# Patient Record
Sex: Female | Born: 1970 | Race: Black or African American | Hispanic: No | Marital: Single | Smoking: Never smoker
Health system: Southern US, Community
[De-identification: ages and names within clinical notes are randomized; demographics above are authoritative.]

## PROBLEM LIST (undated history)

## (undated) DIAGNOSIS — E119 Type 2 diabetes mellitus without complications: Secondary | ICD-10-CM

## (undated) DIAGNOSIS — I10 Essential (primary) hypertension: Secondary | ICD-10-CM

## (undated) HISTORY — PX: ADRENAL GLAND SURGERY: SHX544

## (undated) HISTORY — PX: APPENDECTOMY: SHX54

---

## 2006-11-05 ENCOUNTER — Other Ambulatory Visit: Payer: Self-pay

## 2006-11-06 ENCOUNTER — Observation Stay: Payer: Self-pay | Admitting: *Deleted

## 2006-12-07 ENCOUNTER — Ambulatory Visit: Payer: Self-pay | Admitting: Gastroenterology

## 2006-12-10 ENCOUNTER — Ambulatory Visit: Payer: Self-pay | Admitting: Gastroenterology

## 2007-01-28 ENCOUNTER — Emergency Department: Payer: Self-pay | Admitting: Emergency Medicine

## 2007-02-02 ENCOUNTER — Ambulatory Visit: Payer: Self-pay | Admitting: Internal Medicine

## 2008-11-27 ENCOUNTER — Encounter: Payer: Self-pay | Admitting: Orthopedic Surgery

## 2008-12-26 ENCOUNTER — Encounter: Payer: Self-pay | Admitting: Orthopedic Surgery

## 2009-01-26 ENCOUNTER — Encounter: Payer: Self-pay | Admitting: Orthopedic Surgery

## 2009-11-26 ENCOUNTER — Ambulatory Visit: Payer: Self-pay | Admitting: Family Medicine

## 2009-12-02 ENCOUNTER — Ambulatory Visit: Payer: Self-pay | Admitting: Family Medicine

## 2009-12-05 ENCOUNTER — Ambulatory Visit: Payer: Self-pay | Admitting: Family Medicine

## 2009-12-09 ENCOUNTER — Ambulatory Visit: Payer: Self-pay | Admitting: Oncology

## 2009-12-11 LAB — CANCER ANTIGEN 27.29: CA 27.29: 11.6 U/mL (ref 0.0–38.6)

## 2009-12-12 ENCOUNTER — Ambulatory Visit: Payer: Self-pay | Admitting: Oncology

## 2009-12-26 ENCOUNTER — Ambulatory Visit: Payer: Self-pay | Admitting: Oncology

## 2010-03-07 ENCOUNTER — Ambulatory Visit: Payer: Self-pay | Admitting: Oncology

## 2010-03-27 ENCOUNTER — Ambulatory Visit: Payer: Self-pay | Admitting: Oncology

## 2010-09-15 ENCOUNTER — Ambulatory Visit: Payer: Self-pay | Admitting: Family Medicine

## 2011-03-15 ENCOUNTER — Emergency Department: Payer: Self-pay | Admitting: Emergency Medicine

## 2011-03-15 LAB — COMPREHENSIVE METABOLIC PANEL
Alkaline Phosphatase: 66 U/L (ref 50–136)
Anion Gap: 13 (ref 7–16)
Bilirubin,Total: 0.7 mg/dL (ref 0.2–1.0)
Calcium, Total: 8.2 mg/dL — ABNORMAL LOW (ref 8.5–10.1)
Co2: 22 mmol/L (ref 21–32)
Creatinine: 0.85 mg/dL (ref 0.60–1.30)
EGFR (African American): >60
EGFR (Non-African Amer.): >60
Osmolality: 284 (ref 275–301)
Potassium: 2.7 mmol/L — ABNORMAL LOW (ref 3.5–5.1)
SGOT(AST): 17 U/L (ref 15–37)
SGPT (ALT): 20 U/L

## 2011-03-15 LAB — CBC
HCT: 43 % (ref 35.0–47.0)
MCHC: 32.7 g/dL (ref 32.0–36.0)
Platelet: 193 10*3/uL (ref 150–440)
RBC: 5.04 10*6/uL (ref 3.80–5.20)
RDW: 13.5 % (ref 11.5–14.5)

## 2011-03-15 LAB — TROPONIN I: Troponin-I: <0.02 ng/mL

## 2011-10-03 DIAGNOSIS — D3610 Benign neoplasm of peripheral nerves and autonomic nervous system, unspecified: Secondary | ICD-10-CM | POA: Insufficient documentation

## 2012-11-20 ENCOUNTER — Emergency Department: Payer: Self-pay | Admitting: Emergency Medicine

## 2013-03-14 IMAGING — CR DG CHEST 2V
1 series · 2 of 2 positions shown · non-contrast
Comparison: none

REASON FOR EXAM: chest pain
COMMENTS:

PROCEDURE:     DXR - DXR CHEST PA (OR AP) AND LATERAL  - March 15, 2011  [DATE]
RESULT:     The lungs are clear. The heart and pulmonary vessels are normal.
The bony and mediastinal structures are unremarkable. There is no effusion.
There is no pneumothorax or evidence of congestive failure.

[Series 1: w chest pa · 0.14mm/px · 2 of 2 slices shown]
[im 1/2]
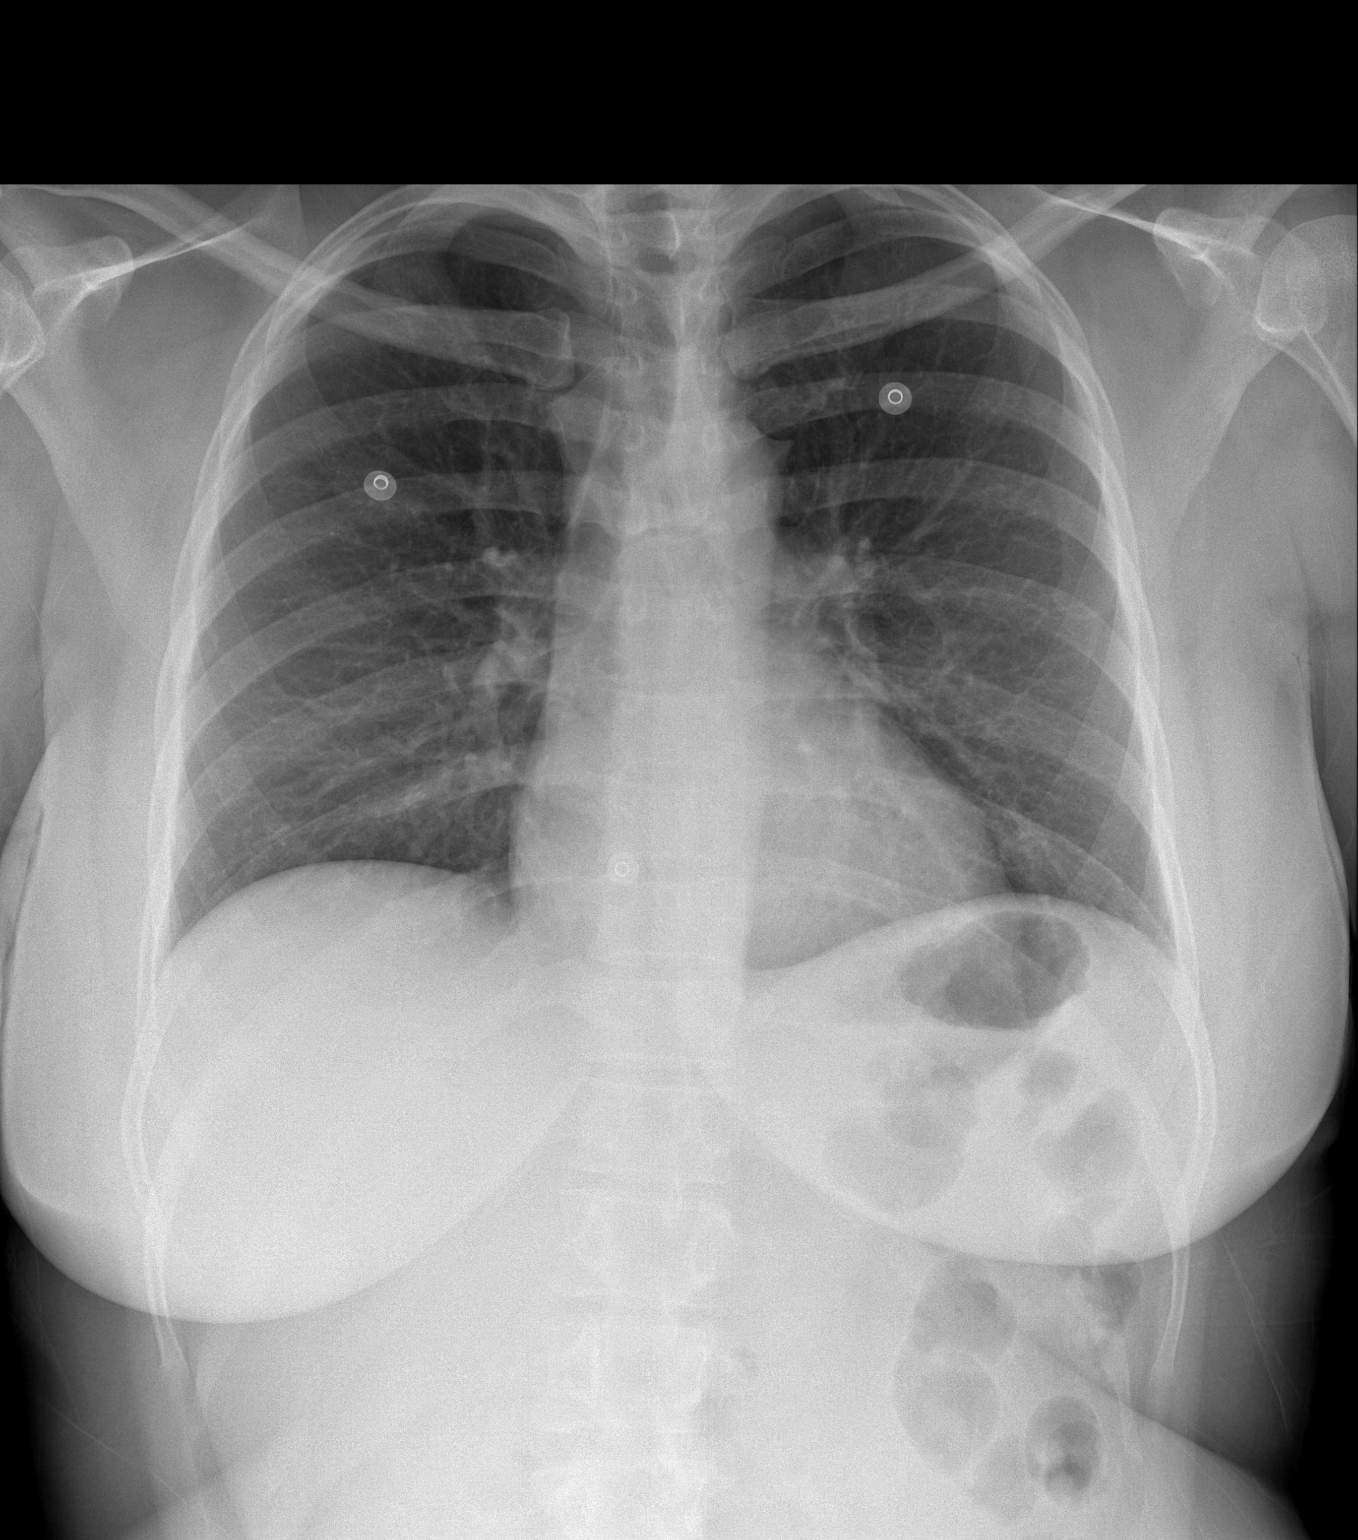
[im 2/2]
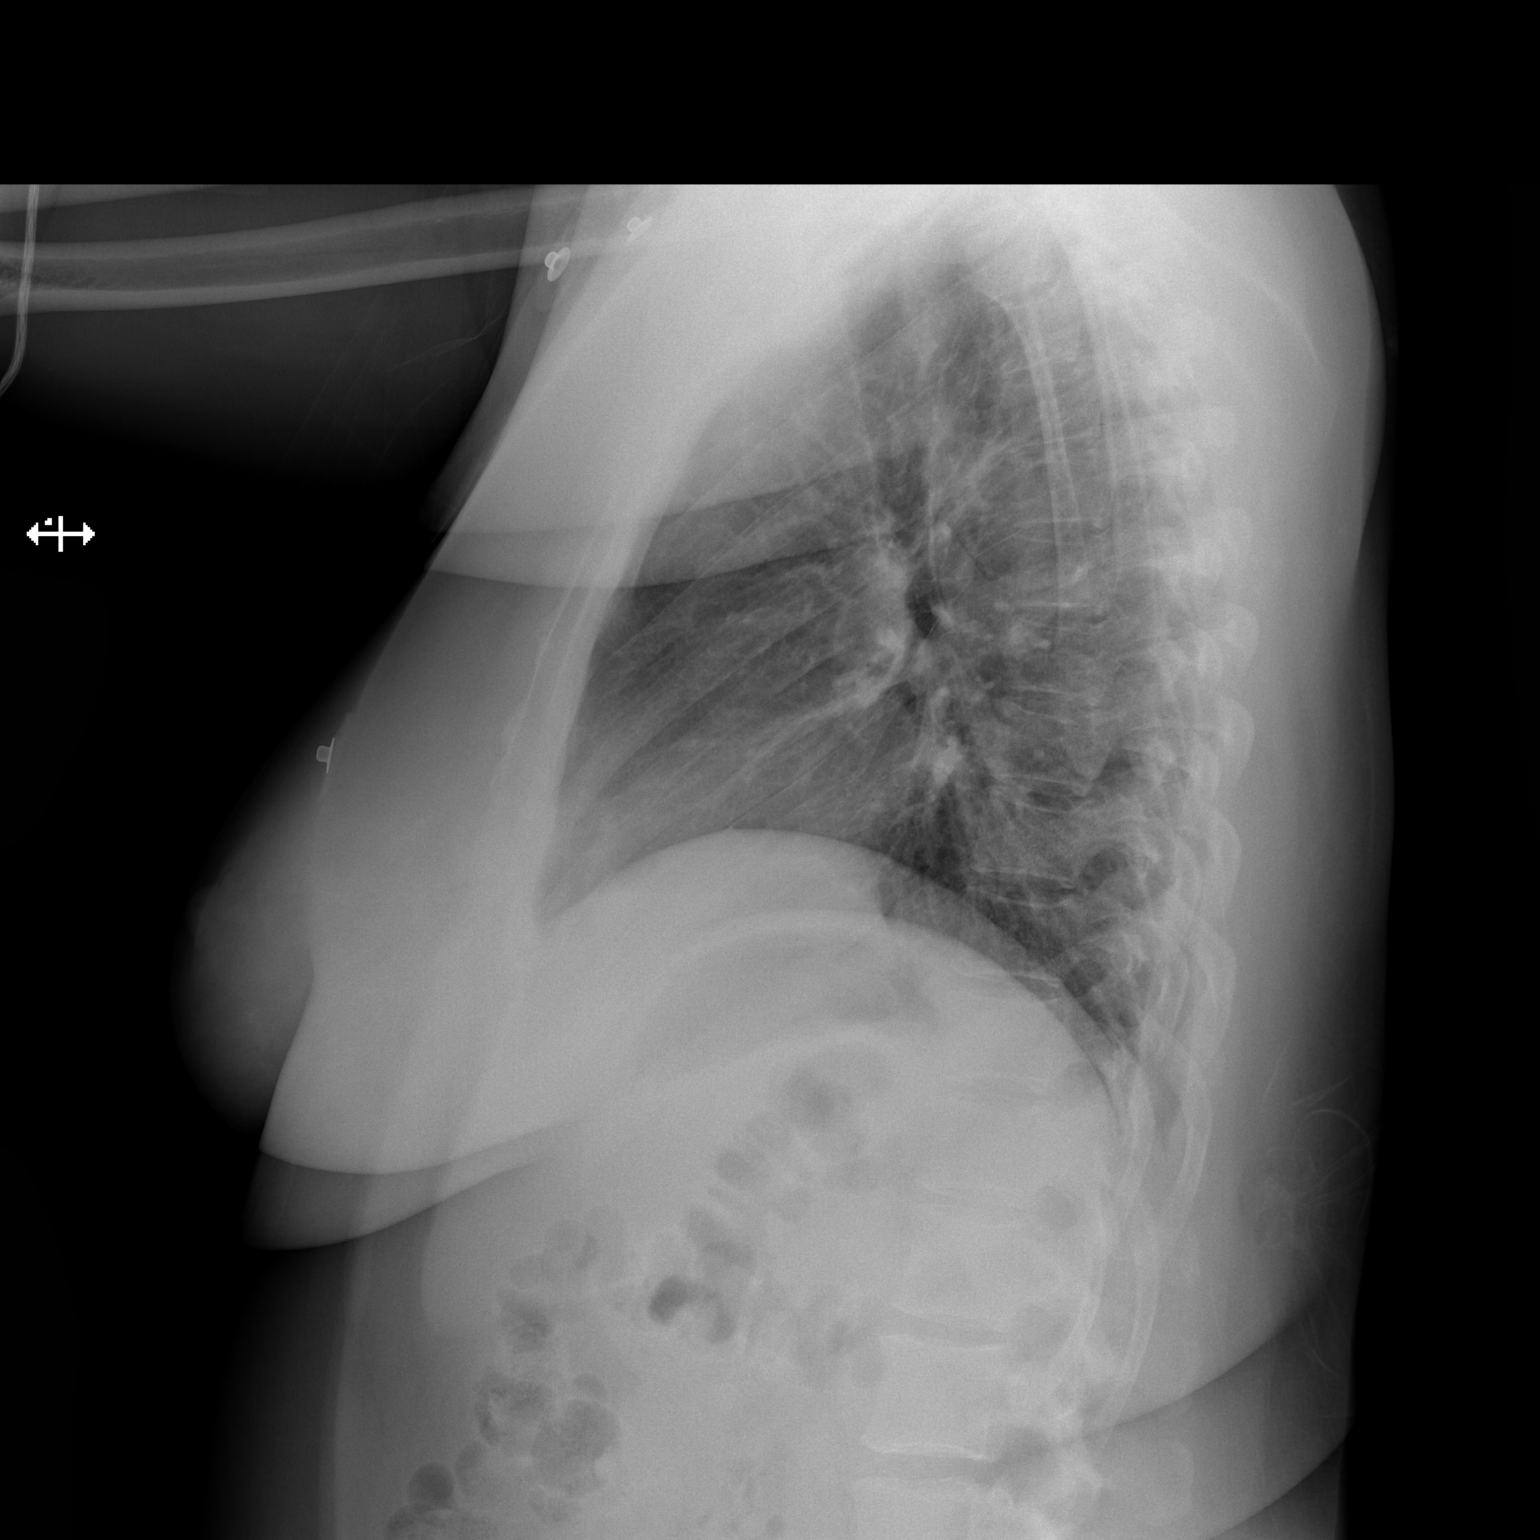

[2 of 2 positions shown; findings below may reference images not displayed]

IMPRESSION: No acute cardiopulmonary disease.

## 2014-12-01 ENCOUNTER — Emergency Department: Payer: BC Managed Care – PPO

## 2014-12-01 ENCOUNTER — Encounter: Payer: Self-pay | Admitting: *Deleted

## 2014-12-01 ENCOUNTER — Emergency Department
Admission: EM | Admit: 2014-12-01 | Discharge: 2014-12-01 | Disposition: A | Discharge status: Home / Self Care | Payer: BC Managed Care – PPO | Attending: Emergency Medicine | Admitting: Emergency Medicine

## 2014-12-01 DIAGNOSIS — I10 Essential (primary) hypertension: Secondary | ICD-10-CM | POA: Diagnosis not present

## 2014-12-01 DIAGNOSIS — M25531 Pain in right wrist: Secondary | ICD-10-CM | POA: Diagnosis not present

## 2014-12-01 DIAGNOSIS — Z880 Allergy status to penicillin: Secondary | ICD-10-CM | POA: Insufficient documentation

## 2014-12-01 DIAGNOSIS — E119 Type 2 diabetes mellitus without complications: Secondary | ICD-10-CM | POA: Diagnosis not present

## 2014-12-01 HISTORY — DX: Essential (primary) hypertension: I10

## 2014-12-01 HISTORY — DX: Type 2 diabetes mellitus without complications: E11.9

## 2014-12-01 MED ORDER — NAPROXEN 500 MG PO TABS: 500.0000 mg | ORAL_TABLET | Freq: Two times a day (BID) | ORAL | Status: DC
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
## 2014-12-01 NOTE — Discharge Instructions (Signed)
Wear wrist support from 3-5 days as needed.

## 2014-12-01 NOTE — ED Notes (Signed)
Did not sign electronically, technical difficulty.   E-sign page was printed and signed by pt. Sent to MR to be scanned
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
## 2017-01-31 ENCOUNTER — Emergency Department: Payer: BC Managed Care – PPO

## 2017-01-31 ENCOUNTER — Emergency Department
Admission: EM | Admit: 2017-01-31 | Discharge: 2017-01-31 | Disposition: A | Discharge status: Home / Self Care | Payer: BC Managed Care – PPO | Attending: Emergency Medicine | Admitting: Emergency Medicine

## 2017-01-31 DIAGNOSIS — Z791 Long term (current) use of non-steroidal anti-inflammatories (NSAID): Secondary | ICD-10-CM | POA: Insufficient documentation

## 2017-01-31 DIAGNOSIS — E119 Type 2 diabetes mellitus without complications: Secondary | ICD-10-CM | POA: Insufficient documentation

## 2017-01-31 DIAGNOSIS — I10 Essential (primary) hypertension: Secondary | ICD-10-CM | POA: Diagnosis not present

## 2017-01-31 DIAGNOSIS — B9789 Other viral agents as the cause of diseases classified elsewhere: Secondary | ICD-10-CM

## 2017-01-31 DIAGNOSIS — B349 Viral infection, unspecified: Secondary | ICD-10-CM | POA: Insufficient documentation

## 2017-01-31 DIAGNOSIS — J069 Acute upper respiratory infection, unspecified: Secondary | ICD-10-CM | POA: Diagnosis not present

## 2017-01-31 DIAGNOSIS — R05 Cough: Secondary | ICD-10-CM | POA: Diagnosis present

## 2017-01-31 LAB — GROUP A STREP BY PCR: Group A Strep by PCR: NOT DETECTED

## 2017-01-31 MED ORDER — LIDOCAINE HCL (PF) 1 % IJ SOLN
5.0000 mL | Freq: Once | INTRAMUSCULAR | Status: AC
  Administered 2017-01-31: 5 mL
  Filled 2017-01-31: qty 5

## 2017-01-31 MED ORDER — BENZONATATE 100 MG PO CAPS: 100.0000 mg | ORAL_CAPSULE | Freq: Four times a day (QID) | ORAL | 0 refills | Status: AC | PRN

## 2017-01-31 NOTE — ED Notes (Signed)
Reviewed discharge instructions, follow-up care, and prescriptions with patient. Patient verbalized understanding of all information reviewed. Patient stable, with no distress noted at this time.    

## 2017-01-31 NOTE — ED Triage Notes (Signed)
Patient c/o productive cough and congestion beginning Wednesday. Patient reports she saw her PCP Friday and was instructed to take Mucinex. Patient reports continued cough and hoarseness.

## 2017-01-31 NOTE — ED Provider Notes (Signed)
[facility] Emergency Department Provider Note  ____________________________________________   First MD Initiated Contact with Patient 01/31/17 [phone]     (approximate)  I have reviewed the triage vital signs and the nursing notes.   HISTORY  Chief Complaint Cough    HPI Rose Thomas is a 47 y.o. female who self presents to the emergency department with 3-4 days of rhinorrhea, low-grade fever, congestion, and nonproductive cough.  She went to her primary care physician last Friday who diagnosed her with an upper respiratory tract infection and told her to take over-the-counter medications.  She comes to the emergency department tonight because her cough has been keeping her up at night.  Her symptoms had insidious onset slowly progressive.  Her cough is now moderate.  Nothing seems to make it better or worse.  Past Medical History:  Diagnosis Date  . Diabetes mellitus without complication (HCC)   . Hypertension     There are no active problems to display for this patient.   Past Surgical History:  Procedure Laterality Date  . ADRENAL GLAND SURGERY     removed on right side.  . APPENDECTOMY      Prior to Admission medications   Medication Sig Start Date End Date Taking? Authorizing Provider  benzonatate (TESSALON PERLES) 100 MG capsule Take 1 capsule (100 mg total) by mouth every 6 (six) hours as needed for cough. 01/31/17 01/31/18  Merrily Brittleifenbark, Fabion Gatson, MD  naproxen (NAPROSYN) 500 MG tablet Take 1 tablet (500 mg total) by mouth 2 (two) times daily with a meal. 12/01/14   Joni ReiningSmith, Ronald K, PA-C    Allergies Percocet [oxycodone-acetaminophen] and Penicillins  No family history on file.  Social History Social History   Tobacco Use  . Smoking status: Never Smoker  . Smokeless tobacco: Never Used  Substance Use Topics  . Alcohol use: Yes    Comment: occasional  . Drug use: Not on file    Review of Systems Constitutional: Positive for fevers ENT:  Positive for sore throat. Cardiovascular: Denies chest pain. Respiratory: Positive for cough. Gastrointestinal: No abdominal pain.  No nausea, no vomiting.  No diarrhea.  No constipation. Musculoskeletal: Negative for back pain. Neurological: Negative for headaches   ____________________________________________   PHYSICAL EXAM:  VITAL SIGNS: ED Triage Vitals  Enc Vitals Group     BP 01/31/17 0139 (!) 155/81     Pulse Rate 01/31/17 0139 98     Resp 01/31/17 0139 18     Temp 01/31/17 0139 99 F (37.2 C)     Temp Source 01/31/17 0139 Oral     SpO2 01/31/17 0139 99 %     Weight 01/31/17 0141 183 lb (83 kg)     Height 01/31/17 0141 5\' 6"  (1.676 m)     Head Circumference --      Peak Flow --      Pain Score 01/31/17 0139 10     Pain Loc --      Pain Edu? --      Excl. in GC? --     Constitutional: Alert and oriented x4 speaks in full clear sentences with somewhat hoarse voice actively coughing with a dry cough during exam Head: Atraumatic. Nose: No congestion/rhinnorhea. Mouth/Throat: No trismus normal oropharynx Neck: No stridor.   Cardiovascular: Regular rate and rhythm Respiratory: Normal respiratory effort.  No retractions.  Clear to auscultation bilaterally Gastrointestinal: Soft nontender Neurologic:  Normal speech and language. No gross focal neurologic deficits are appreciated.  Skin:  Skin is warm, dry and intact. No rash noted.    ____________________________________________  LABS (all labs ordered are listed, but only abnormal results are displayed)  Labs Reviewed  GROUP A STREP BY PCR    Lab work reviewed by me strep negative __________________________________________  EKG   ____________________________________________  RADIOLOGY  Chest x-ray reviewed by me with no acute disease ____________________________________________   DIFFERENTIAL includes but not limited to  Post viral cough, upper respiratory tract infection, laryngitis, pneumonia,  bronchitis   PROCEDURES  Procedure(s) performed: no  Procedures  Critical Care performed: no  Observation: no ____________________________________________   INITIAL IMPRESSION / ASSESSMENT AND PLAN / ED COURSE  Pertinent labs & imaging results that were available during my care of the patient were reviewed by me and considered in my medical decision making (see chart for details).  Patient arrives hemodynamically stable and well-appearing.  She does have a persistent dry cough.  Her lungs are clear and her chest x-ray is negative for acute pathology.  I nebulized 5 cc of 1% lidocaine which completely eliminated the patient's cough and she felt significantly improved.  I had a lengthy discussion with the patient regarding her diagnosis and the predicted clinical course and that cough is the last thing to go away.  She understands to remain well-hydrated and I will prescribe her Jerilynn Som for symptomatic treatment.  She is discharged home in improved condition verbalizes understanding and agreement the plan.      ____________________________________________   FINAL CLINICAL IMPRESSION(S) / ED DIAGNOSES  Final diagnoses:  Viral URI with cough      NEW MEDICATIONS STARTED DURING THIS VISIT:  This SmartLink is deprecated. Use AVSMEDLIST instead to display the medication list for a patient.   Note:  This document was prepared using Dragon voice recognition software and may include unintentional dictation errors.      Merrily Brittle, MD 01/31/17 [phone]

## 2017-01-31 NOTE — Discharge Instructions (Signed)
Fortunately today your x-ray was reassuring and you do not have pneumonia.  It is normal for a cough the last up to a full 3-4 weeks after the infection that you have.  Please make sure you remain well-hydrated and use your cough medication as needed for severe symptoms.  Follow-up with your primary care physician as needed and return to the emergency department for any concerns.  It was a pleasure to take care of you today, and thank you for coming to our emergency department.  If you have any questions or concerns before leaving please ask the nurse to grab me and I'm more than happy to go through your aftercare instructions again.  If you were prescribed any opioid pain medication today such as Norco, Vicodin, Percocet, morphine, hydrocodone, or oxycodone please make sure you do not drive when you are taking this medication as it can alter your ability to drive safely.  If you have any concerns once you are home that you are not improving or are in fact getting worse before you can make it to your follow-up appointment, please do not hesitate to call [phone] and come back for further evaluation.  Merrily BrittleNeil Lucianna Ostlund, MD  Results for orders placed or performed during the hospital encounter of 01/31/17  Group A Strep by PCR  Result Value Ref Range   Group A Strep by PCR NOT DETECTED NOT DETECTED   Dg Chest 2 View  Result Date: 01/31/2017 CLINICAL DATA:  Productive cough and congestion beginning on Wednesday. EXAM: CHEST  2 VIEW COMPARISON:  03/15/2011 FINDINGS: The heart size and mediastinal contours are within normal limits. Both lungs are clear. The visualized skeletal structures are unremarkable. Surgical clips in the right upper quadrant. IMPRESSION: No active cardiopulmonary disease. Electronically Signed   By: Burman NievesWilliam  Stevens M.D.   On: 01/31/2017 02:07

## 2017-10-25 ENCOUNTER — Ambulatory Visit: Payer: BC Managed Care – PPO | Attending: Emergency Medicine

## 2017-10-25 DIAGNOSIS — M25571 Pain in right ankle and joints of right foot: Secondary | ICD-10-CM | POA: Diagnosis not present

## 2017-10-25 DIAGNOSIS — M6281 Muscle weakness (generalized): Secondary | ICD-10-CM | POA: Insufficient documentation

## 2017-10-25 NOTE — Therapy (Addendum)
[facility]YS[facility]ICINE [address] [place], [address] Phone: [phone]   Fax:  [phone]  Physical Therapy Evaluation  Patient Details  Name: Rose Thomas MRN: [id number] Date of Birth: 13-Jun-1970 Referring Provider (PT): Paschal Dopp   Encounter Date: 10/25/2017  PT End of Session - 10/26/17 1100    Visit Number  1    Number of Visits  13    Date for PT Re-Evaluation  12/06/17    Authorization Type  Progress note: 1/10    PT Start Time  1645    PT Stop Time  1730    PT Time Calculation (min)  45 min    Activity Tolerance  Patient tolerated treatment well    Behavior During Therapy  [facility] for tasks assessed/performed       Past Medical History:  Diagnosis Date  . Diabetes mellitus without complication (HCC)   . Hypertension     Past Surgical History:  Procedure Laterality Date  . ADRENAL GLAND SURGERY     removed on right side.  . APPENDECTOMY      There were no vitals filed for this visit.   Subjective Assessment - 10/25/17 1637    Subjective  Ankle pain    Pertinent History  Rose Thomas is a 47 y.o. female who was referred for evaluation of right ankle pain for inversion sprain which occurred stepping up the curb at a high school football game on 09/24/17. She was running away from her neighbors dog this weekend and she felt like she "made it worse" as well. She had immediate and persistent pain after her sprain. She went to the ER where she had imaging which revealed no acute bony fracture. She was provided an air cast but she didn't wear it for too long because it was uncomfortable but she has continued to wear the post-op shoe that was issued. She was advised to see an orthopedist but she has not scheduled an appointment because she isn't sure it is necessary. She reports prior injury while in high school where she tore the ligaments in her right ankle while playing basketball. Patient works as a Physicist, medical.    Limitations  Walking    Diagnostic tests  Plain film radiographs negative for acute bony injury    Patient Stated Goals  Decrease pain and return to function    Currently in Pain?  Yes    Pain Score  6     Pain Location  Ankle    Pain Orientation  Right    Pain Descriptors / Indicators  Aching;Dull;Constant    Pain Type  Acute pain    Pain Radiating Towards  Up her leg    Pain Onset  1 to 4 weeks ago    Pain Frequency  Constant    Aggravating Factors    Walking up steps, certain positions in bed    Pain Relieving Factors  Tylenol, elevation, Biofreeze, ice, heat         [facility] PT Assessment - 10/25/17 1637      Assessment   Medical Diagnosis  R ankle pain    Referring Provider (PT)  Paschal Dopp    Onset Date/Surgical Date  09/24/17    Hand Dominance  Right    Next MD Visit  None scheduled    Prior Therapy  None      Precautions   Precautions  None      Restrictions  Weight Bearing Restrictions  No      Balance Screen   Has the patient fallen in the past 6 months  Yes    How many times?  1    Has the patient had a decrease in activity level because of a fear of falling?   Yes    Is the patient reluctant to leave their home because of a fear of falling?   No      Home Public house manager residence      Prior Function   Level of Independence  Independent    Vocation  Full time employment    Vocation Requirements  Seventh grade teacher      Cognition   Overall Cognitive Status  Within Functional Limits for tasks assessed       SUBJECTIVE  Chief complaint:  Rose Thomas is a 47 y.o. female who was referred for evaluation of right ankle pain for inversion sprain which occurred stepping up the curb at a high school football game on 09/24/17. She was running away from her neighbors dog this weekend and she felt like she "made it worse" as well. She had immediate and persistent pain after her sprain. She went to the ER where she had  imaging which revealed no acute bony fracture. She was provided an air cast but she didn't wear it for too long because it was uncomfortable but she has continued to wear the post-op shoe that was issued. She was advised to see an orthopedist but she has not scheduled an appointment because she isn't sure it is necessary. She reports prior injury while in high school where she tore the ligaments in her right ankle while playing basketball. Patient works as a Tax adviser.  Onset: 09/24/17 Referring Dx: R ankle pain MD: Paschal Dopp Pain: 6/10 Present, 3/10 Best, 10/10 Worst: Aggravating factors: Walking up steps, certain positions in bed Easing factors: Tylenol, elevation, Biofreeze, ice, heat 24 hour pain behavior: Pain is worse as day goes on and at night Ankle surgery: No Recent ankle trauma: Yes Prior history of ankle injury or pain: Yes, high school injury while playing basketball.  Pain quality: pain quality: dull, aching, constant pain Waking pain: Yes Radiating pain: Yes, up the leg  Numbness/Tingling: No Follow-up appointment with MD: No Dominant hand: right Imaging: Yes R ankle 3V negative for acute osseous abnormality   OBJECTIVE  MUSCULOSKELETAL: Tremor: Absent Bulk: Normal Tone: Normal, no clonus No trophic changes noted to foot/ankle. Mild ecchymosis and edema noted. No gross ankle/foot deformity noted. No ecchymosis  Posture No gross abnormalities noted in standing or seated posture  Gait Pt with R antalgic giat while wearing R post-op shoe  Palpation Pain with palpation to R lateral malleolus, over ATF and CFL ligaments. Pain with palpation along fibularis longus and brevis tendons posterior to lateral malleolus. Pain with palpation up lateral calf along muscle bellies of peroneus longus and brevis.   Strength R/L 5/5 Hip flexion 4/4 Hip external rotation 4/4 Hip internal rotation 5/5 Knee extension 4+/4+ Knee flexion 5/5 Ankle Plantarflexion *intact  strength but painful/5 Ankle Dorsiflexion *intact strength but painful/4 Ankle Inversion *intact strength but painful/4+ Ankle Eversion  AROM R/L 62*/60 Ankle Plantarflexion -6*/2 Ankle Dorsiflexion (unweighted) 22*/30 Ankle Inversion 18*/20 Ankle Eversion *Indicates Pain  PROM R/L 62*/60 Ankle Plantarflexion -2*/16 Ankle Dorsiflexion (unweighted) 24*/30 Ankle Inversion 18/20 Ankle Eversion *Indicates Pain  Passive Accessory Motion Superior Tibiofibular Joint: Painful but grossly WNL mobility Inferior Tibiofibular  Joint: Unable to test secondary to pain Talocrural Joint Distraction: Unable to test secondary to pain Talocrural Joint AP: Unable to test secondary to pain Talocrural Joint PA: Unable to test secondary to pain  NEUROLOGICAL:  Mental Status Patient is oriented to person, place and time.  Recent memory is intact.  Remote memory is intact.  Attention span and concentration are intact.  Expressive speech is intact.  Patient's fund of knowledge is within normal limits for educational level.  Sensation Grossly intact to light touch bilateral LEs as determined by testing dermatomes L2-S2 Proprioception and hot/cold testing deferred on this date  Reflexes Deferred  VASCULAR Dorsalis pedis and posterior tibial pulses are palpable  SPECIAL TESTS  Ligamentous Integrity Anterior Drawer (ATF, 10-15 plantarflexion with anterior translation): Negative for laxity but painful Talar Tilt (CFL, inversion): Negative for laxity but painful Eversion Stress Test (Deltoid, eversion): Negative for laxity but painful External Rotation Test (High ankle, dorsiflexion and external rotation): Positive painful Squeeze Test (High ankle): Positive Impingment Sign (Dorsiflexion and eversion): Not examined  Achilles Integrity Thompson Test: Negative  Fracture Screening Metatarsal Axial Loading: Positive for pain with 2-5th metatarsals Tap/Percussion Test: Negative Vibration  Test: Not examined  Pronation/Supination Navicular Drop: Not examined  Nerve Test Tarsal Tunnel Test (maximal DF, EV, toe ext with tapping over tarsal tunnel): Not examined Test for Morton's Neuroma (compress metatarsals and mobilize): Not examined  Other Windlass Mechanism Test: Not examined       Objective measurements completed on examination: See above findings.     TREATMENT  Ther-ex  Seated R ankle circumduction CW/CCW x 20 each; Seated R ankle ABC, 1 round; Seated R ankle long sitting towel calf stretch 30s hold x 3; Pt issued written HEP with extensive education about how to complete correctly.          PT Education - 10/26/17 1059    Education Details  Plan of care, HEP    Person(s) Educated  Patient    Methods  Explanation;Demonstration;Handout    Comprehension  Verbalized understanding       PT Short Term Goals - 10/26/17 1113      PT SHORT TERM GOAL #1   Title  Pt will be independent with HEP in order to decrease ankle pain and increase strength in order to improve pain-free function at home and work.     Time  4    Period  Weeks    Status  New    Target Date  11/15/17        PT Long Term Goals - 10/26/17 1113      PT LONG TERM GOAL #1   Title  Pt will decrease worst pain as reported on NPRS by at least 3 points in order to demonstrate clinically significant reduction in ankle/foot pain.     Baseline  10/25/17: 10/10    Time  6    Period  Weeks    Status  New    Target Date  12/06/17      PT LONG TERM GOAL #2   Title  Pt will improve R ankle dorsiflexion and inversion AROM so that it is within 3 degrees of L side in order to improve gait and functional mobility    Baseline  10/25/17: R/L -2*/16 Ankle Dorsiflexion (unweighted), 24*/30 Ankle Inversion    Time  6    Period  Weeks    Status  New    Target Date  12/06/17  Plan - 10/26/17 1100    Clinical Impression Statement  Pt is a pleasant 47 year-old referred for  right ankle pain.  Mild swelling and bruising noted along lateral aspect of right ankle. Pain with palpation to R lateral malleolus, over ATF and CFL ligaments. Pain with palpation along fibularis longus and brevis tendons posterior to lateral malleolus. Pain with palpation up lateral calf along muscle bellies of peroneus longus and brevis.  Pt able to demonstrate active contraction in R ankle in all directions however pain is reported which limits maximal strength testing. AROM/PROM is most limited for R ankle dorsiflexion and inversion. Positive squeeze and external rotation testing may indicate involvement of possible high ankle sprain. Pt reports pain with axial compression of metatarsals 2-5 however she is able to bear weight and ambulate. She is generally painful with a variety of tests. Pt advised that if she doesn't see improvement in pain-free motion over the next couple weeks she may need to return for repeat imaging. PT examination reveals deficits in gait, pain, strength, and range of motion.   Pt will benefit from PT services to address deficits in strength, mobility, and pain in order to return to full function at home with less ankle/foot pain.      History and Personal Factors relevant to plan of care:  No personal factors/comorbidities, 2 or less body systems/activity limitations/participation restrictions     Clinical Presentation  Unstable    Clinical Decision Making  Low    Rehab Potential  Good    Clinical Impairments Affecting Rehab Potential  Positive: motivation; Negative: duration of symptoms    PT Frequency  2x / week    PT Duration  6 weeks    PT Treatment/Interventions  Aquatic Therapy;Cryotherapy;Electrical Stimulation;Iontophoresis 4mg /ml Dexamethasone;Traction;Ultrasound;Contrast Bath;DME Instruction;Gait training;Stair training;Functional mobility training;Therapeutic activities;Therapeutic exercise;Balance training;Neuromuscular re-education;Patient/family education;Manual  techniques;Passive range of motion;Dry needling;Splinting;Taping;Vestibular;Joint Manipulations    PT Next Visit Plan  Have pt complete FOTO, LEFS. Review HEP Progress R ankle AROM and initiate pain-free isometrics as appropriate    PT Home Exercise Plan  Ankle dorsiflexion stretch in sitting with strap/towel, AROM ankle circles, AROM ankle alphabet    Consulted and Agree with Plan of Care  Patient       Patient will benefit from skilled therapeutic intervention in order to improve the following deficits and impairments:  Abnormal gait, Decreased range of motion, Decreased strength, Pain  Visit Diagnosis: Pain in right ankle and joints of right foot  Muscle weakness (generalized)     Problem List There are no active problems to display for this patient.  Rose Thomas PT, DPT, GCS  Rose Thomas 10/26/2017, 11:18 AM  [place] [facility] PHYSICAL AND SPORTS MEDICINE [address] [place], [address] Phone: [phone]   Fax:  [phone]  Name: Rose Thomas MRN: [id number] Date of Birth: [birth year]

## 2017-10-26 NOTE — Addendum Note (Signed)
Addended by: Ria Comment D on: 10/26/2017 11:21 AM   Modules accepted: Orders

## 2017-10-27 ENCOUNTER — Telehealth: Payer: Self-pay

## 2017-10-27 NOTE — Telephone Encounter (Signed)
No show. Called patient at phone number provided. Unable to leave a message due to the mailbox being full.

## 2017-11-01 ENCOUNTER — Ambulatory Visit: Payer: BC Managed Care – PPO

## 2017-11-03 ENCOUNTER — Ambulatory Visit: Payer: BC Managed Care – PPO | Attending: Emergency Medicine

## 2017-11-03 DIAGNOSIS — M6281 Muscle weakness (generalized): Secondary | ICD-10-CM | POA: Diagnosis present

## 2017-11-03 DIAGNOSIS — M25571 Pain in right ankle and joints of right foot: Secondary | ICD-10-CM

## 2017-11-03 NOTE — Therapy (Signed)
[facility]IC[facility] [address] [place], [address] Phone: [phone]   Fax:  [phone]  Physical Therapy Treatment  Patient Details  Name: Rose Thomas MRN: [id number] Date of Birth: [birth year] Referring Provider (PT): Paschal Dopp   Encounter Date: 11/03/2017  PT End of Session - 11/03/17 0950    Visit Number  2    Number of Visits  13    Date for PT Re-Evaluation  12/06/17    Authorization Type  Progress note: 1/10    PT Start Time  0950    PT Stop Time  1031    PT Time Calculation (min)  41 min    Activity Tolerance  Patient tolerated treatment well    Behavior During Therapy  [facility] for tasks assessed/performed       Past Medical History:  Diagnosis Date  . Diabetes mellitus without complication (HCC)   . Hypertension     Past Surgical History:  Procedure Laterality Date  . ADRENAL GLAND SURGERY     removed on right side.  . APPENDECTOMY      There were no vitals filed for this visit.  Subjective Assessment - 11/03/17 0950    Subjective  Has not been wearing her air cast because it kept slipping off. Tried an ankle brace and has had swelling. 5/10 R ankle pain currently.     Pertinent History  Rose Thomas is a 47 y.o. female who was referred for evaluation of right ankle pain for inversion sprain which occurred stepping up the curb at a high school football game on 09/24/17. She was running away from her neighbors dog this weekend and she felt like she "made it worse" as well. She had immediate and persistent pain after her sprain. She went to the ER where she had imaging which revealed no acute bony fracture. She was provided an air cast but she didn't wear it for too long because it was uncomfortable but she has continued to wear the post-op shoe that was issued. She was advised to see an orthopedist but she has not scheduled an appointment because she isn't sure it is necessary. She reports prior injury  while in high school where she tore the ligaments in her right ankle while playing basketball. Patient works as a Tax adviser.    Limitations  Walking    Diagnostic tests  Plain film radiographs negative for acute bony injury    Patient Stated Goals  Decrease pain and return to function    Currently in Pain?  Yes    Pain Score  5     Pain Onset  1 to 4 weeks ago                               PT Education - 11/03/17 1006    Education Details  ther-ex    [employer]) Educated  Patient    Methods  Explanation;Demonstration;Verbal cues;Tactile cues    Comprehension  Returned demonstration;Verbalized understanding        Objective   Ther-ex   Gait with brace on. Improved R LE stance phase.   Pt was recommended to continue wearing the ankle brace when on her feet. Ice R ankle 15 min propped at lunch and at end of day at least. Pt demonstrated and verbalized understanding   Standing ankle DF/PF on rocker board 2x  Seated manual R ankle DF stretch  with PT 30 seconds x 5  Tight gastroc muscle felt  Prone R ankle DF isometrics 10x5 seconds for 2 sets. Manual resistance from PT  Prone R ankle EV isometrics at neutral, pain free level of effort. 10x5 seconds for 2 sets    Improved exercise technique, movement at target joints, use of target muscles after mod verbal, visual, tactile cues.    Manual therapy  Prone STM to R gastroc and soleus   Worked on gentle ankle movement, decreasing R gastroc and soleus muscle tension, tibialis muscle strengthening, gentle activation of peroneal muscles to help decrease ankle pain and improve ability to stand and walk more comfortably. Decreased pain to 4/10 after session. Patient will benefit from continued skilled physical therapy services to decreased pain and improve function.      PT Short Term Goals - 10/26/17 1113      PT SHORT TERM GOAL #1   Title  Pt will be independent with HEP in order to decrease ankle  pain and increase strength in order to improve pain-free function at home and work.     Time  4    Period  Weeks    Status  New    Target Date  11/15/17        PT Long Term Goals - 10/26/17 1113      PT LONG TERM GOAL #1   Title  Pt will decrease worst pain as reported on NPRS by at least 3 points in order to demonstrate clinically significant reduction in ankle/foot pain.     Baseline  10/25/17: 10/10    Time  6    Period  Weeks    Status  New    Target Date  12/06/17      PT LONG TERM GOAL #2   Title  Pt will improve R ankle dorsiflexion and inversion AROM so that it is within 3 degrees of L side in order to improve gait and functional mobility    Baseline  10/25/17: R/L -2*/16 Ankle Dorsiflexion (unweighted), 24*/30 Ankle Inversion    Time  6    Period  Weeks    Status  New    Target Date  12/06/17            Plan - 11/03/17 1220    Clinical Impression Statement  Worked on gentle ankle movement, decreasing R gastroc and soleus muscle tension, tibialis muscle strengthening, gentle activation of peroneal muscles to help decrease ankle pain and improve ability to stand and walk more comfortably. Decreased pain to 4/10 after session. Patient will benefit from continued skilled physical therapy services to decreased pain and improve function.     Rehab Potential  Good    Clinical Impairments Affecting Rehab Potential  Positive: motivation; Negative: duration of symptoms    PT Frequency  2x / week    PT Duration  6 weeks    PT Treatment/Interventions  Aquatic Therapy;Cryotherapy;Electrical Stimulation;Iontophoresis 4mg /ml Dexamethasone;Traction;Ultrasound;Contrast Bath;DME Instruction;Gait training;Stair training;Functional mobility training;Therapeutic activities;Therapeutic exercise;Balance training;Neuromuscular re-education;Patient/family education;Manual techniques;Passive range of motion;Dry needling;Splinting;Taping;Vestibular;Joint Manipulations    PT Next Visit Plan  Have  pt complete FOTO, LEFS. Review HEP Progress R ankle AROM and initiate pain-free isometrics as appropriate    PT Home Exercise Plan  Ankle dorsiflexion stretch in sitting with strap/towel, AROM ankle circles, AROM ankle alphabet    Consulted and Agree with Plan of Care  Patient       Patient will benefit from skilled therapeutic intervention in order to improve the following  deficits and impairments:  Abnormal gait, Decreased range of motion, Decreased strength, Pain  Visit Diagnosis: Pain in right ankle and joints of right foot  Muscle weakness (generalized)     Problem List There are no active problems to display for this patient.   Loralyn Freshwater PT, DPT   11/03/2017, 12:25 PM  [place] [facility] PHYSICAL AND SPORTS MEDICINE [address] [place], [address] Phone: [phone]   Fax:  [phone]  Name: Rose Thomas MRN: [id number] Date of Birth: 05-07-1970

## 2017-11-04 ENCOUNTER — Ambulatory Visit: Payer: BC Managed Care – PPO

## 2017-11-08 ENCOUNTER — Ambulatory Visit: Payer: BC Managed Care – PPO

## 2017-11-08 DIAGNOSIS — M25571 Pain in right ankle and joints of right foot: Secondary | ICD-10-CM | POA: Diagnosis not present

## 2017-11-08 DIAGNOSIS — M6281 Muscle weakness (generalized): Secondary | ICD-10-CM

## 2017-11-08 NOTE — Therapy (Signed)
[place] [facility] PH[facility] MEDICINE [address], [place], [address] Phone: [phone]   Fax:  [phone]  Physical Therapy Treatment  Patient Details  Name: Rose Thomas MRN: [id number] Date of Birth: 10-Jan-1971 Referring Provider (PT): Paschal Dopp   Encounter Date: 11/08/2017  PT End of Session - 11/08/17 1735    Visit Number  3    Number of Visits  13    Date for PT Re-Evaluation  12/06/17    PT Start Time  1735    PT Stop Time  1818    PT Time Calculation (min)  43 min    Activity Tolerance  Patient tolerated treatment well    Behavior During Therapy  [facility] for tasks assessed/performed       Past Medical History:  Diagnosis Date  . Diabetes mellitus without complication (HCC)   . Hypertension     Past Surgical History:  Procedure Laterality Date  . ADRENAL GLAND SURGERY     removed on right side.  . APPENDECTOMY      There were no vitals filed for this visit.  Subjective Assessment - 11/08/17 1736    Subjective  R ankle is tight right now. Did not get to elevate her ankle today.  4-5/10 currently. Feels tight R lateral ankle. Was not as tight after last session. Last session was in the morning.  Also painted multiple rooms in her home this past weekend.     Pertinent History  Rose Thomas is a 47 y.o. female who was referred for evaluation of right ankle pain for inversion sprain which occurred stepping up the curb at a high school football game on 09/24/17. She was running away from her neighbors dog this weekend and she felt like she "made it worse" as well. She had immediate and persistent pain after her sprain. She went to the ER where she had imaging which revealed no acute bony fracture. She was provided an air cast but she didn't wear it for too long because it was uncomfortable but she has continued to wear the post-op shoe that was issued. She was advised to see an orthopedist but she has not scheduled an  appointment because she isn't sure it is necessary. She reports prior injury while in high school where she tore the ligaments in her right ankle while playing basketball. Patient works as a Tax adviser.    Limitations  Walking    Diagnostic tests  Plain film radiographs negative for acute bony injury    Patient Stated Goals  Decrease pain and return to function    Currently in Pain?  Yes    Pain Score  5    4-5/10 currently   Pain Onset  1 to 4 weeks ago                               PT Education - 11/08/17 1829    Education Details  ther-ex    [employer]) Educated  Patient    Methods  Explanation;Demonstration;Tactile cues;Verbal cues    Comprehension  Returned demonstration;Verbalized understanding        Objective   Ther-ex   Seated ankle DF/PF on rocker board 2 minutes Seated ankle EV/IV with PT assist on rocker board 2 min  Seated ankle circles 10x3 each clockwise and counterclockwise direction.   S/L hip abduction  10x3 R side  10x3 L side  Prone glute  max extension 5x R  Prone hip extension   10x3 R   Seated R ankle DF isometrics 10x5 seconds for 2 sets. Manual resistance from PT  Seated ankle EV isometrics at neutral, pain free level of effort. 10x5 seconds for 2 sets    Improved exercise technique, movement at target joints, use of target muscles after mod verbal, visual, tactile cues.      Manual therapy  Prone STM to R soleus  Calf squeeze in R produces radiating pain posterior leg to posterior lateral malleolus. No increased redness or swelling or warmth.   Circumference 5.5 inches inferior to patella:  14.5 inches R gastroc 14 inches L gastroc  Pt was recommended to let her MD know about her increase gastroc size on R side with discomfort with calf squeeze just to rule out DVT. Pt was also recommended to get medical attention if she has difficulty breathing. Pt demonstrated and verbalized understanding.     Decreased R ankle pain/tightness to 3/10 after session      Worked on gentle R ankle movement, isometric muscle activation of tibialis anterior and peroneal muscles, and glute med and max strengthening to promote femoral control with standing activities. Decreased R ankle pain to 3/10 after session.       PT Short Term Goals - 10/26/17 1113      PT SHORT TERM GOAL #1   Title  Pt will be independent with HEP in order to decrease ankle pain and increase strength in order to improve pain-free function at home and work.     Time  4    Period  Weeks    Status  New    Target Date  11/15/17        PT Long Term Goals - 10/26/17 1113      PT LONG TERM GOAL #1   Title  Pt will decrease worst pain as reported on NPRS by at least 3 points in order to demonstrate clinically significant reduction in ankle/foot pain.     Baseline  10/25/17: 10/10    Time  6    Period  Weeks    Status  New    Target Date  12/06/17      PT LONG TERM GOAL #2   Title  Pt will improve R ankle dorsiflexion and inversion AROM so that it is within 3 degrees of L side in order to improve gait and functional mobility    Baseline  10/25/17: R/L -2*/16 Ankle Dorsiflexion (unweighted), 24*/30 Ankle Inversion    Time  6    Period  Weeks    Status  New    Target Date  12/06/17            Plan - 11/08/17 1826    Clinical Impression Statement  Worked on gentle R ankle movement, isometric muscle activation of tibialis anterior and peroneal muscles, and glute med and max strengthening to promote femoral control with standing activities. Decreased R ankle pain to 3/10 after session.     Rehab Potential  Good    Clinical Impairments Affecting Rehab Potential  Positive: motivation; Negative: duration of symptoms    PT Frequency  2x / week    PT Duration  6 weeks    PT Treatment/Interventions  Aquatic Therapy;Cryotherapy;Electrical Stimulation;Iontophoresis 4mg /ml Dexamethasone;Traction;Ultrasound;Contrast  Bath;DME Instruction;Gait training;Stair training;Functional mobility training;Therapeutic activities;Therapeutic exercise;Balance training;Neuromuscular re-education;Patient/family education;Manual techniques;Passive range of motion;Dry needling;Splinting;Taping;Vestibular;Joint Manipulations    PT Next Visit Plan  Have pt complete FOTO, LEFS. Review HEP Progress  R ankle AROM and initiate pain-free isometrics as appropriate    PT Home Exercise Plan  Ankle dorsiflexion stretch in sitting with strap/towel, AROM ankle circles, AROM ankle alphabet    Consulted and Agree with Plan of Care  Patient       Patient will benefit from skilled therapeutic intervention in order to improve the following deficits and impairments:  Abnormal gait, Decreased range of motion, Decreased strength, Pain  Visit Diagnosis: Pain in right ankle and joints of right foot  Muscle weakness (generalized)     Problem List There are no active problems to display for this patient.   Loralyn Freshwater PT, DPT   11/08/2017, 6:32 PM  [place] [facility] PHYSICAL AND SPORTS MEDICINE [address] [place], [address] Phone: [phone]   Fax:  [phone]  Name: CAMBELL RICKENBACH MRN: [id number] Date of Birth: [birth year]

## 2017-11-10 ENCOUNTER — Ambulatory Visit: Payer: BC Managed Care – PPO

## 2017-11-10 DIAGNOSIS — M25571 Pain in right ankle and joints of right foot: Secondary | ICD-10-CM

## 2017-11-10 DIAGNOSIS — M6281 Muscle weakness (generalized): Secondary | ICD-10-CM

## 2017-11-10 NOTE — Therapy (Signed)
[place] [facility]ORTS MEDICINE [address] [place], [address] Phone: [phone]   Fax:  [phone]  Physical Therapy Treatment  Patient Details  Name: Rose Thomas MRN: [id number] Date of Birth: [birth year] Referring Provider (PT): Paschal Dopp   Encounter Date: 11/10/2017  PT End of Session - 11/10/17 1738    Visit Number  4    Number of Visits  13    Date for PT Re-Evaluation  12/06/17    PT Start Time  1739    PT Stop Time  1819    PT Time Calculation (min)  40 min    Activity Tolerance  Patient tolerated treatment well    Behavior During Therapy  [facility] for tasks assessed/performed       Past Medical History:  Diagnosis Date  . Diabetes mellitus without complication (HCC)   . Hypertension     Past Surgical History:  Procedure Laterality Date  . ADRENAL GLAND SURGERY     removed on right side.  . APPENDECTOMY      There were no vitals filed for this visit.  Subjective Assessment - 11/10/17 1739    Subjective  Has not been able to make an appointment with her doctor about her leg yet. R ankle is tender today. Did a lot of walking today. 4-5/10 currently.  R ankle feels most tight in the morning.     Pertinent History  Rose Thomas is a 47 y.o. female who was referred for evaluation of right ankle pain for inversion sprain which occurred stepping up the curb at a high school football game on 09/24/17. She was running away from her neighbors dog this weekend and she felt like she "made it worse" as well. She had immediate and persistent pain after her sprain. She went to the ER where she had imaging which revealed no acute bony fracture. She was provided an air cast but she didn't wear it for too long because it was uncomfortable but she has continued to wear the post-op shoe that was issued. She was advised to see an orthopedist but she has not scheduled an appointment because she isn't sure it is necessary. She reports  prior injury while in high school where she tore the ligaments in her right ankle while playing basketball. Patient works as a Tax adviser.    Limitations  Walking    Diagnostic tests  Plain film radiographs negative for acute bony injury    Patient Stated Goals  Decrease pain and return to function    Currently in Pain?  Yes    Pain Score  5    4-5/10   Pain Onset  More than a month ago                               PT Education - 11/10/17 1744    Education Details  ther-ex    Person(s) Educated  Patient    Methods  Explanation;Demonstration;Tactile cues;Verbal cues    Comprehension  Verbalized understanding;Returned demonstration      Objective   Ther-ex   S/L hip abduction             10x3 R side             10x3 L side  Prone hip extension              10x3 R  Seated R ankle DF  isometrics 10x5 seconds for 3 sets. Manual resistance from PT  Seated ankle EV isometrics at neutral, pain-free level of effort. 10x5 seconds for 3 sets  Forward weight shifting onto dyna disc with R LE 3x5 seconds  Pain along posterior lateral leg and lateral malleolus  Standing forward weight shifting to forefeet 10x5 seconds for 2 sets with B UE assist   Side stepping with red band resistance at thighs 32 ft to the R and 32 ft to the L  Forward wedding march 32 ft x2 with red band around distal thighs  Peroneal muscle stretch felt  Standing on Air Ex pad B LE 1 min x 3 with manual perturbation from PT to promote ankle stability strength   Improved exercise technique, movement at target joints, use of target muscles after mod verbal, visual, tactile cues.   Worked on glute med and max strength as well as closed chain ankle strengthening. Stretch along the peroneal muscles felt with standing exercises. Decreased R ankle pain to 3/10 after session. Patient will benefit from continued skilled physical therapy services to decrease pain, improve ankle  strength and function.       PT Short Term Goals - 10/26/17 1113      PT SHORT TERM GOAL #1   Title  Pt will be independent with HEP in order to decrease ankle pain and increase strength in order to improve pain-free function at home and work.     Time  4    Period  Weeks    Status  New    Target Date  11/15/17        PT Long Term Goals - 10/26/17 1113      PT LONG TERM GOAL #1   Title  Pt will decrease worst pain as reported on NPRS by at least 3 points in order to demonstrate clinically significant reduction in ankle/foot pain.     Baseline  10/25/17: 10/10    Time  6    Period  Weeks    Status  New    Target Date  12/06/17      PT LONG TERM GOAL #2   Title  Pt will improve R ankle dorsiflexion and inversion AROM so that it is within 3 degrees of L side in order to improve gait and functional mobility    Baseline  10/25/17: R/L -2*/16 Ankle Dorsiflexion (unweighted), 24*/30 Ankle Inversion    Time  6    Period  Weeks    Status  New    Target Date  12/06/17            Plan - 11/10/17 1732    Clinical Impression Statement  Worked on glute med and max strength as well as closed chain ankle strengthening. Stretch along the peroneal muscles felt with standing exercises. Decreased R ankle pain to 3/10 after session. Patient will benefit from continued skilled physical therapy services to decrease pain, improve ankle strength and function.     Rehab Potential  Good    Clinical Impairments Affecting Rehab Potential  Positive: motivation; Negative: duration of symptoms    PT Frequency  2x / week    PT Duration  6 weeks    PT Treatment/Interventions  Aquatic Therapy;Cryotherapy;Electrical Stimulation;Iontophoresis 4mg /ml Dexamethasone;Traction;Ultrasound;Contrast Bath;DME Instruction;Gait training;Stair training;Functional mobility training;Therapeutic activities;Therapeutic exercise;Balance training;Neuromuscular re-education;Patient/family education;Manual techniques;Passive  range of motion;Dry needling;Splinting;Taping;Vestibular;Joint Manipulations    PT Next Visit Plan  Have pt complete FOTO, LEFS. Review HEP Progress R ankle AROM and initiate pain-free isometrics as  appropriate    PT Home Exercise Plan  Ankle dorsiflexion stretch in sitting with strap/towel, AROM ankle circles, AROM ankle alphabet    Consulted and Agree with Plan of Care  Patient       Patient will benefit from skilled therapeutic intervention in order to improve the following deficits and impairments:  Abnormal gait, Decreased range of motion, Decreased strength, Pain  Visit Diagnosis: Pain in right ankle and joints of right foot  Muscle weakness (generalized)     Problem List There are no active problems to display for this patient.   Loralyn Freshwater PT, DPT   11/10/2017, 6:32 PM  [place] [facility] PHYSICAL AND SPORTS MEDICINE [address], [place], [address] Phone: [phone]   Fax:  [phone]  Name: Rose Thomas MRN: [id number] Date of Birth: Mar 11, 1970

## 2017-11-16 ENCOUNTER — Ambulatory Visit: Payer: BC Managed Care – PPO

## 2017-11-16 DIAGNOSIS — M25571 Pain in right ankle and joints of right foot: Secondary | ICD-10-CM

## 2017-11-16 DIAGNOSIS — M6281 Muscle weakness (generalized): Secondary | ICD-10-CM

## 2017-11-16 NOTE — Therapy (Signed)
[place] [facility] PHYSICAL [facility] SPORTS MEDICINE [address], [place], [address] Phone: [phone]   Fax:  [phone]  Physical Therapy Treatment  Patient Details  Name: HAMSINI VERRILLI MRN: [id number] Date of Birth: 1970-11-07 Referring Provider (PT): Paschal Dopp   Encounter Date: 11/16/2017  PT End of Session - 11/16/17 1742    Visit Number  5    Number of Visits  13    Date for PT Re-Evaluation  12/06/17    PT Start Time  1742    PT Stop Time  1824    PT Time Calculation (min)  42 min    Activity Tolerance  Patient tolerated treatment well    Behavior During Therapy  [facility] for tasks assessed/performed       Past Medical History:  Diagnosis Date  . Diabetes mellitus without complication (HCC)   . Hypertension     Past Surgical History:  Procedure Laterality Date  . ADRENAL GLAND SURGERY     removed on right side.  . APPENDECTOMY      There were no vitals filed for this visit.  Subjective Assessment - 11/16/17 1744    Subjective  Pt states that a cat ran across in front of her this morning and pt got surprized and fell. Her L ankle turned in. R ankle feels ok currently (4/10), L ankle is 8/10 currently.  7/10 at worst for the past 7 days.     Pertinent History  Sherran Margolis is a 47 y.o. female who was referred for evaluation of right ankle pain for inversion sprain which occurred stepping up the curb at a high school football game on 09/24/17. She was running away from her neighbors dog this weekend and she felt like she "made it worse" as well. She had immediate and persistent pain after her sprain. She went to the ER where she had imaging which revealed no acute bony fracture. She was provided an air cast but she didn't wear it for too long because it was uncomfortable but she has continued to wear the post-op shoe that was issued. She was advised to see an orthopedist but she has not scheduled an appointment because she isn't sure it is  necessary. She reports prior injury while in high school where she tore the ligaments in her right ankle while playing basketball. Patient works as a Tax adviser.    Limitations  Walking    Diagnostic tests  Plain film radiographs negative for acute bony injury    Patient Stated Goals  Decrease pain and return to function    Currently in Pain?  Yes    Pain Score  8    L ankle   Pain Onset  More than a month ago                               PT Education - 11/16/17 1750    Education Details  ther-ex    Person(s) Educated  Patient    Methods  Explanation;Demonstration;Tactile cues;Verbal cues    Comprehension  Returned demonstration;Verbalized understanding       Objective   TTP L lateral ankle. No pain with L ankle PF and IV. Pain with L ankle DF and EV   Ther-ex   S/L hip abduction 10x3 R side 10x3 L side  Prone hip extension  10x3 R LE  10x3 L LE   SeatedR ankle DF isometrics  10x5 seconds for 3 sets. Manual resistance from PT  Seated ankle EV isometrics at neutral, pain-free level of effort. 10x5 seconds for 3 sets  Side stepping at [employer] Ex beam 5x each way  Standing on Air Ex pad B LE 1 min x 3 with manual perturbation from PT to promote ankle stability strength   Standing forward weight shifting to forefeet 10x5 seconds for 2 sets with B UE assist    Forward wedding march 32 ft x2 with red band around distal thighs             No stretch feeling feeling felt  Side stepping with red band resistance at thighs 32 ft to the R and 32 ft to the L for 2 sets  Improved exercise technique, movement at target joints, use of target muscles after min to mod verbal, visual, tactile cues.   Continued working on B hip and R ankle strengthening in both open and closed chain positions. Decreased R ankle pain to 0/10 and L ankle pain to 4/10 after session.       PT Short Term Goals - 10/26/17  1113      PT SHORT TERM GOAL #1   Title  Pt will be independent with HEP in order to decrease ankle pain and increase strength in order to improve pain-free function at home and work.     Time  4    Period  Weeks    Status  New    Target Date  11/15/17        PT Long Term Goals - 10/26/17 1113      PT LONG TERM GOAL #1   Title  Pt will decrease worst pain as reported on NPRS by at least 3 points in order to demonstrate clinically significant reduction in ankle/foot pain.     Baseline  10/25/17: 10/10    Time  6    Period  Weeks    Status  New    Target Date  12/06/17      PT LONG TERM GOAL #2   Title  Pt will improve R ankle dorsiflexion and inversion AROM so that it is within 3 degrees of L side in order to improve gait and functional mobility    Baseline  10/25/17: R/L -2*/16 Ankle Dorsiflexion (unweighted), 24*/30 Ankle Inversion    Time  6    Period  Weeks    Status  New    Target Date  12/06/17            Plan - 11/16/17 1742    Clinical Impression Statement  Continued working on B hip and R ankle strengthening in both open and closed chain positions. Decreased R ankle pain to 0/10 and L ankle pain to 4/10 after session.     Rehab Potential  Good    Clinical Impairments Affecting Rehab Potential  Positive: motivation; Negative: duration of symptoms    PT Frequency  2x / week    PT Duration  6 weeks    PT Treatment/Interventions  Aquatic Therapy;Cryotherapy;Electrical Stimulation;Iontophoresis 4mg /ml Dexamethasone;Traction;Ultrasound;Contrast Bath;DME Instruction;Gait training;Stair training;Functional mobility training;Therapeutic activities;Therapeutic exercise;Balance training;Neuromuscular re-education;Patient/family education;Manual techniques;Passive range of motion;Dry needling;Splinting;Taping;Vestibular;Joint Manipulations    PT Next Visit Plan  Have pt complete FOTO, LEFS. Review HEP Progress R ankle AROM and initiate pain-free isometrics as appropriate    PT  Home Exercise Plan  Ankle dorsiflexion stretch in sitting with strap/towel, AROM ankle circles, AROM ankle alphabet    Consulted and Agree with Plan  of Care  Patient       Patient will benefit from skilled therapeutic intervention in order to improve the following deficits and impairments:  Abnormal gait, Decreased range of motion, Decreased strength, Pain  Visit Diagnosis: Pain in right ankle and joints of right foot  Muscle weakness (generalized)     Problem List There are no active problems to display for this patient.   Loralyn Freshwater PT, DPT   11/16/2017, 6:29 PM  [place] [facility] PHYSICAL AND SPORTS MEDICINE [address], [place], [address] Phone: [phone]   Fax:  [phone]  Name: SERAPHINA MITCHNER MRN: [id number] Date of Birth: 07-15-1970

## 2017-11-23 ENCOUNTER — Ambulatory Visit: Payer: BC Managed Care – PPO

## 2017-11-23 DIAGNOSIS — M25571 Pain in right ankle and joints of right foot: Secondary | ICD-10-CM | POA: Diagnosis not present

## 2017-11-23 DIAGNOSIS — M6281 Muscle weakness (generalized): Secondary | ICD-10-CM

## 2017-11-23 NOTE — Therapy (Signed)
[facility] [address] [place], [address] Phone: [phone]   Fax:  [phone]  Physical Therapy Treatment  Patient Details  Name: Rose Thomas MRN: [id number] Date of Birth: [birth year] Referring Provider (PT): Paschal Dopp   Encounter Date: 11/23/2017  PT End of Session - 11/23/17 1714    Visit Number  6    Number of Visits  13    Date for PT Re-Evaluation  12/06/17    PT Start Time  1714   pt arrived late   PT Stop Time  1747    PT Time Calculation (min)  33 min    Activity Tolerance  Patient tolerated treatment well    Behavior During Therapy  [facility] for tasks assessed/performed       Past Medical History:  Diagnosis Date  . Diabetes mellitus without complication (HCC)   . Hypertension     Past Surgical History:  Procedure Laterality Date  . ADRENAL GLAND SURGERY     removed on right side.  . APPENDECTOMY      There were no vitals filed for this visit.  Subjective Assessment - 11/23/17 1715    Subjective  R ankle feels achy. May be the wheather.     Pertinent History  Rose Thomas is a 47 y.o. female who was referred for evaluation of right ankle pain for inversion sprain which occurred stepping up the curb at a high school football game on 09/24/17. She was running away from her neighbors dog this weekend and she felt like she "made it worse" as well. She had immediate and persistent pain after her sprain. She went to the ER where she had imaging which revealed no acute bony fracture. She was provided an air cast but she didn't wear it for too long because it was uncomfortable but she has continued to wear the post-op shoe that was issued. She was advised to see an orthopedist but she has not scheduled an appointment because she isn't sure it is necessary. She reports prior injury while in high school where she tore the ligaments in her right ankle while playing basketball. Patient works as a Physicist, medical.    Limitations  Walking    Diagnostic tests  Plain film radiographs negative for acute bony injury    Patient Stated Goals  Decrease pain and return to function    Currently in Pain?  Yes    Pain Score  4    dull achy pain   Pain Onset  More than a month ago                               PT Education - 11/23/17 1715    Education Details  ther-ex    Person(s) Educated  Patient    Methods  Explanation;Demonstration;Tactile cues;Verbal cues    Comprehension  Returned demonstration;Verbalized understanding      Objective    Ther-ex   S/L hip abduction 10x3 R side 10x3 L side  Prone hip extension  10x3 R LE             10x3 L LE    Forward wedding march 32 ft x4 with red band around distal thighs   Side stepping with red band resistance at thighs 32 ft to the R and 32 ft to the L for 2 sets  Standing forward weight shifting to forefeet 10x10  seconds for 2 sets with B UE assist  Standing on Air Ex pad B LE 1 min x 3 with manual perturbation from PT to promote ankle stability strength   Side stepping at Air Ex beam 5x each way  Forward step up onto and over Air Ex pad with R LE, no UE assist 10x2  Lateral step up onto Air Ex pad with R LE, no UE assist 10x2    Improved exercise technique, movement at target joints, use of target muscles after min to mod verbal, visual, tactile cues.     Continued working on glute med and max strengthening as well as ankle stability exercises to promote function. Pt tolerated session well without aggravation of symptoms.     PT Short Term Goals - 10/26/17 1113      PT SHORT TERM GOAL #1   Title  Pt will be independent with HEP in order to decrease ankle pain and increase strength in order to improve pain-free function at home and work.     Time  4    Period  Weeks    Status  New    Target Date  11/15/17        PT Long Term Goals -  10/26/17 1113      PT LONG TERM GOAL #1   Title  Pt will decrease worst pain as reported on NPRS by at least 3 points in order to demonstrate clinically significant reduction in ankle/foot pain.     Baseline  10/25/17: 10/10    Time  6    Period  Weeks    Status  New    Target Date  12/06/17      PT LONG TERM GOAL #2   Title  Pt will improve R ankle dorsiflexion and inversion AROM so that it is within 3 degrees of L side in order to improve gait and functional mobility    Baseline  10/25/17: R/L -2*/16 Ankle Dorsiflexion (unweighted), 24*/30 Ankle Inversion    Time  6    Period  Weeks    Status  New    Target Date  12/06/17            Plan - 11/23/17 1714    Clinical Impression Statement  Continued working on glute med and max strengthening as well as ankle stability exercises to promote function. Pt tolerated session well without aggravation of symptoms.     Rehab Potential  Good    Clinical Impairments Affecting Rehab Potential  Positive: motivation; Negative: duration of symptoms    PT Frequency  2x / week    PT Duration  6 weeks    PT Treatment/Interventions  Aquatic Therapy;Cryotherapy;Electrical Stimulation;Iontophoresis 4mg /ml Dexamethasone;Traction;Ultrasound;Contrast Bath;DME Instruction;Gait training;Stair training;Functional mobility training;Therapeutic activities;Therapeutic exercise;Balance training;Neuromuscular re-education;Patient/family education;Manual techniques;Passive range of motion;Dry needling;Splinting;Taping;Vestibular;Joint Manipulations    PT Next Visit Plan  Have pt complete FOTO, LEFS. Review HEP Progress R ankle AROM and initiate pain-free isometrics as appropriate    PT Home Exercise Plan  Ankle dorsiflexion stretch in sitting with strap/towel, AROM ankle circles, AROM ankle alphabet    Consulted and Agree with Plan of Care  Patient       Patient will benefit from skilled therapeutic intervention in order to improve the following deficits and  impairments:  Abnormal gait, Decreased range of motion, Decreased strength, Pain  Visit Diagnosis: Pain in right ankle and joints of right foot  Muscle weakness (generalized)     Problem List There are no active problems to  display for this patient.   Loralyn Freshwater PT, DPT   11/23/2017, 6:44 PM  [facility] PHYSICAL AND SPORTS MEDICINE [address] [place], [address] Phone: [phone]   Fax:  [phone]  Name: Rose Thomas MRN: [id number] Date of Birth: 07-Jun-1970

## 2017-11-25 ENCOUNTER — Ambulatory Visit: Payer: BC Managed Care – PPO

## 2017-11-30 ENCOUNTER — Ambulatory Visit: Payer: BC Managed Care – PPO | Attending: Emergency Medicine

## 2017-11-30 DIAGNOSIS — M25571 Pain in right ankle and joints of right foot: Secondary | ICD-10-CM | POA: Diagnosis present

## 2017-11-30 DIAGNOSIS — M6281 Muscle weakness (generalized): Secondary | ICD-10-CM | POA: Diagnosis present

## 2017-11-30 NOTE — Therapy (Signed)
[facility] PH[facility]TS [facility]ICINE [address] [place], [address] Phone: [phone]   Fax:  [phone]  Physical Therapy Treatment  Patient Details  Name: Rose Thomas MRN: [id number] Date of Birth: 11/26/1970 Referring Provider (PT): Paschal Dopp   Encounter Date: 11/30/2017  PT End of Session - 11/30/17 1708    Visit Number  6    Number of Visits  13    Date for PT Re-Evaluation  12/06/17    PT Start Time  1708    PT Stop Time  1748    PT Time Calculation (min)  40 min    Activity Tolerance  Patient tolerated treatment well    Behavior During Therapy  [facility] for tasks assessed/performed       Past Medical History:  Diagnosis Date  . Diabetes mellitus without complication (HCC)   . Hypertension     Past Surgical History:  Procedure Laterality Date  . ADRENAL GLAND SURGERY     removed on right side.  . APPENDECTOMY      There were no vitals filed for this visit.  Subjective Assessment - 11/30/17 1709    Subjective  Feels R lateral ankle ache from the weather.  7/10 ache currently.  Was ok after last session. L ankle is better now. No pain.     Pertinent History  Rose Thomas is a 47 y.o. female who was referred for evaluation of right ankle pain for inversion sprain which occurred stepping up the curb at a high school football game on 09/24/17. She was running away from her neighbors dog this weekend and she felt like she "made it worse" as well. She had immediate and persistent pain after her sprain. She went to the ER where she had imaging which revealed no acute bony fracture. She was provided an air cast but she didn't wear it for too long because it was uncomfortable but she has continued to wear the post-op shoe that was issued. She was advised to see an orthopedist but she has not scheduled an appointment because she isn't sure it is necessary. She reports prior injury while in high school where she tore the ligaments in  her right ankle while playing basketball. Patient works as a Tax adviser.    Limitations  Walking    Diagnostic tests  Plain film radiographs negative for acute bony injury    Patient Stated Goals  Decrease pain and return to function    Currently in Pain?  Yes    Pain Score  7     Pain Onset  More than a month ago                               PT Education - 11/30/17 1715    Education Details  ther-ex    Person(s) Educated  Patient    Methods  Explanation;Demonstration;Tactile cues;Verbal cues    Comprehension  Returned demonstration;Verbalized understanding      Objective     Ther-ex  Forward wedding march with green band resisting hip abduction/ER 32 ft x 4  Side stepping with green band resistance at thighs 32 ft to the R and 32 ft to the L for 2 sets   SeatedR ankle DF isometrics 10x5 seconds for3sets. Manual resistance from PT  Seated ankle EV isometrics at neutral, pain-free level of effort. 10x5 seconds for3sets  Seated manually resisted R knee flexion  targeting the medial hamstrings to help decrease ER of R tibia 10x2   Standing on Air Ex pad B LE 1 min x 3 with manual perturbation from PT to promote ankle stability strength  Side stepping at Air Ex beam 6x each way   Improved exercise technique, movement at target joints, use of target muscles after min to mod verbal, visual, tactile cues.      Manual therapy  Supine manual ankle distraction  Decreased R ankle ache to 5/10 afterwards    Worked on gentle R ankle distraction which helped decrease her ankle ache to 5/10 from the starting 7/10 level. Continued working on ankle strengthening, hip strengthening and stability exercises to help decrease pain when performing standing tasks.      PT Short Term Goals - 10/26/17 1113      PT SHORT TERM GOAL #1   Title  Pt will be independent with HEP in order to decrease ankle pain and increase strength in order to  improve pain-free function at home and work.     Time  4    Period  Weeks    Status  New    Target Date  11/15/17        PT Long Term Goals - 10/26/17 1113      PT LONG TERM GOAL #1   Title  Pt will decrease worst pain as reported on NPRS by at least 3 points in order to demonstrate clinically significant reduction in ankle/foot pain.     Baseline  10/25/17: 10/10    Time  6    Period  Weeks    Status  New    Target Date  12/06/17      PT LONG TERM GOAL #2   Title  Pt will improve R ankle dorsiflexion and inversion AROM so that it is within 3 degrees of L side in order to improve gait and functional mobility    Baseline  10/25/17: R/L -2*/16 Ankle Dorsiflexion (unweighted), 24*/30 Ankle Inversion    Time  6    Period  Weeks    Status  New    Target Date  12/06/17            Plan - 11/30/17 1715    Clinical Impression Statement  Worked on gentle R ankle distraction which helped decrease her ankle ache to 5/10 from the starting 7/10 level. Continued working on ankle strengthening, hip strengthening and stability exercises to help decrease pain when performing standing tasks.      Rehab Potential  Good    Clinical Impairments Affecting Rehab Potential  Positive: motivation; Negative: duration of symptoms    PT Frequency  2x / week    PT Duration  6 weeks    PT Treatment/Interventions  Aquatic Therapy;Cryotherapy;Electrical Stimulation;Iontophoresis 4mg /ml Dexamethasone;Traction;Ultrasound;Contrast Bath;DME Instruction;Gait training;Stair training;Functional mobility training;Therapeutic activities;Therapeutic exercise;Balance training;Neuromuscular re-education;Patient/family education;Manual techniques;Passive range of motion;Dry needling;Splinting;Taping;Vestibular;Joint Manipulations    PT Next Visit Plan  Have pt complete FOTO, LEFS. Review HEP Progress R ankle AROM and initiate pain-free isometrics as appropriate    PT Home Exercise Plan  Ankle dorsiflexion stretch in  sitting with strap/towel, AROM ankle circles, AROM ankle alphabet    Consulted and Agree with Plan of Care  Patient       Patient will benefit from skilled therapeutic intervention in order to improve the following deficits and impairments:  Abnormal gait, Decreased range of motion, Decreased strength, Pain  Visit Diagnosis: Pain in right ankle and joints of right foot  Muscle weakness (generalized)     Problem List There are no active problems to display for this patient.   Loralyn Freshwater PT, DPT  11/30/2017, 7:43 PM  [facility] PHYS[facility] AND SPORTS MEDICINE [address], [place], [address] Phone: [phone]   Fax:  [phone]  Name: MARLYCE MCDOUGALD MRN: [id number] Date of Birth: 09/16/1970

## 2017-12-02 ENCOUNTER — Telehealth: Payer: Self-pay

## 2017-12-02 ENCOUNTER — Ambulatory Visit: Payer: BC Managed Care – PPO

## 2017-12-02 NOTE — Telephone Encounter (Signed)
Pt called back. Pt states that her ankle is not as bad today. Has not been cold.  3/10 dull ache currently. Colder temperatures increase her dull ache feeling. Pt was also recommended to perform her side stepping and forward wedding march exercise at home to help her ankle. Pt verbalized understanding. Pt states that she will be able to make it to her next follow up session.

## 2017-12-02 NOTE — Telephone Encounter (Signed)
No show. Called patient phone number provided. Unable to leave a message secondary to mail box being full.

## 2017-12-06 ENCOUNTER — Ambulatory Visit: Payer: BC Managed Care – PPO

## 2017-12-06 DIAGNOSIS — M25571 Pain in right ankle and joints of right foot: Secondary | ICD-10-CM | POA: Diagnosis not present

## 2017-12-06 DIAGNOSIS — M6281 Muscle weakness (generalized): Secondary | ICD-10-CM

## 2017-12-06 NOTE — Therapy (Signed)
[place] PHYSICAL AND SPORTS MEDICINE [address] [place], [address] Phone: [phone]   Fax:  [phone]  Physical Therapy Treatment  Patient Details  Name: Rose Thomas MRN: [id number] Date of Birth: May 13, 1970 Referring Provider (PT): Derrell Lolling   Encounter Date: 12/06/2017  PT End of Session - 12/06/17 1653    Visit Number  8    Number of Visits  19    Date for PT Re-Evaluation  12/23/17    PT Start Time  4818    PT Stop Time  1732    PT Time Calculation (min)  39 min    Activity Tolerance  Patient tolerated treatment well    Behavior During Therapy  [facility] for tasks assessed/performed       Past Medical History:  Diagnosis Date  . Diabetes mellitus without complication ([place])   . Hypertension     Past Surgical History:  Procedure Laterality Date  . ADRENAL GLAND SURGERY     removed on right side.  . APPENDECTOMY      There were no vitals filed for this visit.  Subjective Assessment - 12/06/17 1653    Subjective  R ankle feels pretty good today. 2/10 currently. Wants to see how it is when the temperature drops.  4/10 at worst for the past 7 days. Wants to continue with PT until the end of the month.     Pertinent History  Rose Thomas is a 47 y.o. female who was referred for evaluation of right ankle pain for inversion sprain which occurred stepping up the curb at a high school football game on 09/24/17. She was running away from her neighbors dog this weekend and she felt like she "made it worse" as well. She had immediate and persistent pain after her sprain. She went to the ER where she had imaging which revealed no acute bony fracture. She was provided an air cast but she didn't wear it for too long because it was uncomfortable but she has continued to wear the post-op shoe that was issued. She was advised to see an orthopedist but she has not scheduled an appointment because she isn't sure it is necessary. She reports  prior injury while in high school where she tore the ligaments in her right ankle while playing basketball. Patient works as a Careers adviser.    Limitations  Walking    Diagnostic tests  Plain film radiographs negative for acute bony injury    Patient Stated Goals  Decrease pain and return to function    Currently in Pain?  Yes    Pain Score  2     Pain Onset  More than a month ago         [facility] PT Assessment - 12/06/17 1700      AROM   Right Ankle Dorsiflexion  -3    Right Ankle Inversion  28                           PT Education - 12/06/17 1730    Education Details  ther-ex    Person(s) Educated  Patient    Methods  Explanation;Demonstration;Tactile cues;Verbal cues    Comprehension  Returned demonstration;Verbalized understanding         Objective    Manual therapy  Seated gentle manual R ankle distraction  No R ankle pain afterwards    Ther-ex  Supine R ankle DF and IV  AROM  Seated ankle DF isometrics (self manual resistance)  10x5 seconds for 2 sets   Seated ankle EV isometrics (self manual resistance  10x5 seconds (for 2 sets)   Forward step up onto dyna disc 10x2  Lateral step up onto dyna disc 10x2  Standing mini squats on Air Ex Pad 10x  SLS on R LE 30 seconds x 3   Improved exercise technique, movement at target joints, use of target muscles after min to mod verbal, visual, tactile cues.     Pt demonstrates overall decreased R ankle pain and improved IV ROM since initial evaluation. Continued working on decreasing pressure to R ankle, ankle stability and control as well as balance. No ankle pain after manual therapy. No complain of increased pain throughout treatment. Pt still demonstrates ankle pain and decreased ROM and would benefit from continued skilled physical therapy services to address the aforementioned deficits.              PT Short Term Goals - 12/06/17 1703      PT SHORT TERM GOAL  #1   Title  Pt will be independent with HEP in order to decrease ankle pain and increase strength in order to improve pain-free function at home and work.     Baseline  Pt states performing her exercises at home. (12/06/2017)    Time  4    Period  Weeks    Status  Achieved    Target Date  11/15/17        PT Long Term Goals - 12/06/17 1721      PT LONG TERM GOAL #1   Title  Pt will decrease worst pain as reported on NPRS by at least 3 points in order to demonstrate clinically significant reduction in ankle/foot pain.     Baseline  10/25/17: 10/10; 4/10 (12/06/2017)    Time  6    Period  Weeks    Status  Achieved    Target Date  12/06/17      PT LONG TERM GOAL #2   Title  Pt will improve R ankle dorsiflexion and inversion AROM so that it is within 3 degrees of L side in order to improve gait and functional mobility    Baseline  10/25/17: R/L -2*/16 Ankle Dorsiflexion (unweighted), 24*/30 Ankle Inversion; R ankle DF -3 degrees, Inversion 28 degrees (12/06/2017)    Time  3    Period  Weeks    Status  Partially Met    Target Date  12/23/17      PT LONG TERM GOAL #3   Title  Patient will have a decrease in R ankle pain to 3/10 or less at worst to promote ability to perform standing tasks.     Baseline  4/10 R ankle pain at worst (12/06/2017)    Time  3    Period  Weeks    Status  New    Target Date  12/23/17            Plan - 12/06/17 1731    Clinical Impression Statement  Pt demonstrates overall decreased R ankle pain and improved IV ROM since initial evaluation. Continued working on decreasing pressure to R ankle, ankle stability and control as well as balance. No ankle pain after manual therapy. No complain of increased pain throughout treatment. Pt still demonstrates ankle pain and decreased ROM and would benefit from continued skilled physical therapy services to address the aforementioned deficits.     History and Personal Factors  relevant to plan of care:  No personal  factors/comorbidities, 2 or less body systems/activity limitations/participation restrictions      Clinical Presentation  Stable    Clinical Presentation due to:  patient making progress with overall pain goal    Clinical Decision Making  Low    Rehab Potential  Good    Clinical Impairments Affecting Rehab Potential  Positive: motivation; Negative: duration of symptoms    PT Frequency  2x / week    PT Duration  3 weeks    PT Treatment/Interventions  Aquatic Therapy;Cryotherapy;Electrical Stimulation;Iontophoresis 23m/ml Dexamethasone;Traction;Ultrasound;Contrast Bath;DME Instruction;Gait training;Stair training;Functional mobility training;Therapeutic activities;Therapeutic exercise;Balance training;Neuromuscular re-education;Patient/family education;Manual techniques;Passive range of motion;Dry needling;Splinting;Taping;Vestibular;Joint Manipulations    PT Next Visit Plan  glute med and max strengthenining ankle stability, manual techniques, modalities PRN.     PT Home Exercise Plan  --    Consulted and Agree with Plan of Care  Patient       Patient will benefit from skilled therapeutic intervention in order to improve the following deficits and impairments:  Abnormal gait, Decreased range of motion, Decreased strength, Pain  Visit Diagnosis: Pain in right ankle and joints of right foot - Plan: PT plan of care cert/re-cert  Muscle weakness (generalized) - Plan: PT plan of care cert/re-cert     Problem List There are no active problems to display for this patient.   MJoneen BoersPT, DPT   12/06/2017, 8:51 PM  [facility] [address] [place] [address]Phone: [phone]  Fax:  [phone] Name: TSEYNABOU FULTSMRN: [id number] of Birth: 624-Sep-1972

## 2017-12-09 ENCOUNTER — Ambulatory Visit: Payer: BC Managed Care – PPO

## 2017-12-13 ENCOUNTER — Ambulatory Visit: Payer: BC Managed Care – PPO

## 2017-12-13 ENCOUNTER — Telehealth: Payer: Self-pay

## 2017-12-13 NOTE — Telephone Encounter (Signed)
No show. Called patient phone number provided. Unable to leave a message secondary to her mailbox unable to take messages.

## 2017-12-16 ENCOUNTER — Ambulatory Visit: Payer: BC Managed Care – PPO

## 2017-12-20 ENCOUNTER — Ambulatory Visit: Payer: BC Managed Care – PPO

## 2017-12-22 ENCOUNTER — Ambulatory Visit: Payer: BC Managed Care – PPO

## 2017-12-27 ENCOUNTER — Ambulatory Visit: Payer: BC Managed Care – PPO

## 2017-12-29 ENCOUNTER — Ambulatory Visit: Payer: BC Managed Care – PPO

## 2018-03-07 ENCOUNTER — Other Ambulatory Visit: Payer: Self-pay

## 2018-03-07 ENCOUNTER — Encounter: Payer: Self-pay | Admitting: Emergency Medicine

## 2018-03-07 ENCOUNTER — Emergency Department
Admission: EM | Admit: 2018-03-07 | Discharge: 2018-03-07 | Disposition: A | Discharge status: Home / Self Care | Payer: BC Managed Care – PPO | Attending: Emergency Medicine | Admitting: Emergency Medicine

## 2018-03-07 ENCOUNTER — Emergency Department: Payer: BC Managed Care – PPO

## 2018-03-07 DIAGNOSIS — E119 Type 2 diabetes mellitus without complications: Secondary | ICD-10-CM | POA: Insufficient documentation

## 2018-03-07 DIAGNOSIS — M79671 Pain in right foot: Secondary | ICD-10-CM

## 2018-03-07 DIAGNOSIS — I10 Essential (primary) hypertension: Secondary | ICD-10-CM | POA: Insufficient documentation

## 2018-03-07 MED ORDER — IBUPROFEN 600 MG PO TABS: 600.0000 mg | ORAL_TABLET | Freq: Four times a day (QID) | ORAL | 0 refills | Status: DC | PRN

## 2018-03-07 MED ORDER — KETOROLAC TROMETHAMINE 30 MG/ML IJ SOLN
30.0000 mg | Freq: Once | INTRAMUSCULAR | Status: AC
  Administered 2018-03-07: 30 mg via INTRAMUSCULAR
  Filled 2018-03-07: qty 1

## 2018-03-07 NOTE — ED Provider Notes (Signed)
[facility] Emergency Department Provider Note  ____________________________________________  Time seen: Approximately 10:14 PM  I have reviewed the triage vital signs and the nursing notes.   HISTORY  Chief Complaint Foot Pain    HPI Rose Thomas is a 48 y.o. female that presents to the emergency department for evaluation of ankle pain and foot swelling today.  Patient states that she injured ankle in September.  She went to PT and pain improved.  Swelling never seemed to completely resolved.  She was encouraged to have foot re-x-rayed but did not feel the need for this at that time.  She does not recall any trauma today.  She did step in a hole on Wednesday.  She states that her toes feel swollen.  Past Medical History:  Diagnosis Date  . Diabetes mellitus without complication (HCC)   . Hypertension     There are no active problems to display for this patient.   Past Surgical History:  Procedure Laterality Date  . ADRENAL GLAND SURGERY     removed on right side.  . APPENDECTOMY      Prior to Admission medications   Medication Sig Start Date End Date Taking? Authorizing Provider  ibuprofen (ADVIL,MOTRIN) 600 MG tablet Take 1 tablet (600 mg total) by mouth every 6 (six) hours as needed. 03/07/18   Enid DerryWagner, Koen Antilla, PA-C  naproxen (NAPROSYN) 500 MG tablet Take 1 tablet (500 mg total) by mouth 2 (two) times daily with a meal. 12/01/14   Joni ReiningSmith, Ronald K, PA-C    Allergies Percocet [oxycodone-acetaminophen] and Penicillins  No family history on file.  Social History Social History   Tobacco Use  . Smoking status: Never Smoker  . Smokeless tobacco: Never Used  Substance Use Topics  . Alcohol use: Yes    Comment: occasional  . Drug use: Not on file     Review of Systems  Respiratory: No SOB. Gastrointestinal:  No nausea, no vomiting.  Musculoskeletal: Positive for foot pain. Skin: Negative for rash, abrasions, lacerations,  ecchymosis. Neurological: Negative for headaches, numbness or tingling   ____________________________________________   PHYSICAL EXAM:  VITAL SIGNS: ED Triage Vitals [03/07/18 2127]  Enc Vitals Group     BP (!) 159/85     Pulse Rate 88     Resp 18     Temp 98.6 F (37 C)     Temp Source Oral     SpO2 98 %     Weight 165 lb (74.8 kg)     Height 5\' 6"  (1.676 m)     Head Circumference      Peak Flow      Pain Score 7     Pain Loc      Pain Edu?      Excl. in [place]?      Constitutional: Alert and oriented. Well appearing and in no acute distress. Eyes: Conjunctivae are normal. PERRL. EOMI. Head: Atraumatic. ENT:      Ears:      Nose: No congestion/rhinnorhea.      Mouth/Throat: Mucous membranes are moist.  Neck: No stridor.  Cardiovascular: Normal rate, regular rhythm.  Good peripheral circulation.  Cap refill less than 3 seconds. Respiratory: Normal respiratory effort without tachypnea or retractions. Lungs CTAB. Good air entry to the bases with no decreased or absent breath sounds. Musculoskeletal: Full range of motion to all extremities. No gross deformities appreciated.  Minimal swelling over lateral malleolus.  No erythema.  Antalgic gait Neurologic:  Normal speech and  language. No gross focal neurologic deficits are appreciated.  Skin:  Skin is warm, dry and intact. No rash noted. Psychiatric: Mood and affect are normal. Speech and behavior are normal. Patient exhibits appropriate insight and judgement.   ____________________________________________   LABS (all labs ordered are listed, but only abnormal results are displayed)  Labs Reviewed - No data to display ____________________________________________  EKG   ____________________________________________  RADIOLOGY Lexine Baton, personally viewed and evaluated these images (plain radiographs) as part of my medical decision making, as well as reviewing the written report by the radiologist.  Dg Foot  Complete Right  Result Date: 03/07/2018 CLINICAL DATA:  Right foot pain mainly at heel EXAM: RIGHT FOOT COMPLETE - 3+ VIEW COMPARISON:  None. FINDINGS: There is no evidence of fracture or dislocation. There is no evidence of arthropathy or other focal bone abnormality. Soft tissues are unremarkable. IMPRESSION: Negative. Electronically Signed   By: Charlett Nose M.D.   On: 03/07/2018 22:06    ____________________________________________    PROCEDURES  Procedure(s) performed:    Procedures    Medications  ketorolac (TORADOL) 30 MG/ML injection 30 mg (30 mg Intramuscular Given 03/07/18 2256)     ____________________________________________   INITIAL IMPRESSION / ASSESSMENT AND PLAN / ED COURSE  Pertinent labs & imaging results that were available during my care of the patient were reviewed by me and considered in my medical decision making (see chart for details).  Review of the [place] CSRS was performed in accordance of the [facility] prior to dispensing any controlled drugs.   Patient presented to emergency department for evaluation of foot pain today.  Vital signs and exam are reassuring.  X-ray negative for acute bony abnormalities.  Exam is overall unremarkable.  Ankle was Ace wrapped.  Crutches were given.  Patient will be discharged home with prescriptions for Motrin. Patient is to follow up with podiatry as directed. Patient is given ED precautions to return to the ED for any worsening or new symptoms.     ____________________________________________  FINAL CLINICAL IMPRESSION(S) / ED DIAGNOSES  Final diagnoses:  Right foot pain      NEW MEDICATIONS STARTED DURING THIS VISIT:  ED Discharge Orders         Ordered    ibuprofen (ADVIL,MOTRIN) 600 MG tablet  Every 6 hours PRN     03/07/18 2301              This chart was dictated using voice recognition software/Dragon. Despite best efforts to proofread, errors can occur which can change the meaning. Any change  was purely unintentional.    Enid Derry, PA-C 03/07/18 2340    Don Perking, [place], MD 03/13/18 1414

## 2018-03-07 NOTE — ED Triage Notes (Addendum)
Pt presents to ED with right foot pain located mostly at the heel and radiating up into her ankle. Pt states she injured her foot in sept and tore ligaments after a fall. Went to PT and pain / injury improved but swelling never resolved completely. Pt states this morning she woke up and foot was very swollen and painful with ambulation. No known injury.

## 2018-12-08 ENCOUNTER — Emergency Department
Admission: EM | Admit: 2018-12-08 | Discharge: 2018-12-08 | Disposition: A | Discharge status: Home / Self Care | Payer: BC Managed Care – PPO | Attending: Emergency Medicine | Admitting: Emergency Medicine

## 2018-12-08 ENCOUNTER — Emergency Department: Payer: BC Managed Care – PPO

## 2018-12-08 ENCOUNTER — Other Ambulatory Visit: Payer: Self-pay

## 2018-12-08 ENCOUNTER — Encounter: Payer: Self-pay | Admitting: Emergency Medicine

## 2018-12-08 DIAGNOSIS — R1011 Right upper quadrant pain: Secondary | ICD-10-CM | POA: Diagnosis not present

## 2018-12-08 DIAGNOSIS — R0789 Other chest pain: Secondary | ICD-10-CM | POA: Insufficient documentation

## 2018-12-08 DIAGNOSIS — I10 Essential (primary) hypertension: Secondary | ICD-10-CM | POA: Insufficient documentation

## 2018-12-08 DIAGNOSIS — Z79899 Other long term (current) drug therapy: Secondary | ICD-10-CM | POA: Diagnosis not present

## 2018-12-08 DIAGNOSIS — R079 Chest pain, unspecified: Secondary | ICD-10-CM

## 2018-12-08 DIAGNOSIS — R110 Nausea: Secondary | ICD-10-CM | POA: Insufficient documentation

## 2018-12-08 DIAGNOSIS — E1165 Type 2 diabetes mellitus with hyperglycemia: Secondary | ICD-10-CM | POA: Diagnosis not present

## 2018-12-08 DIAGNOSIS — R739 Hyperglycemia, unspecified: Secondary | ICD-10-CM

## 2018-12-08 LAB — COMPREHENSIVE METABOLIC PANEL
ALT: 16 U/L (ref 0–44)
AST: 15 U/L (ref 15–41)
Albumin: 3.9 g/dL (ref 3.5–5.0)
Alkaline Phosphatase: 89 U/L (ref 38–126)
Anion gap: 10 (ref 5–15)
BUN: 15 mg/dL (ref 6–20)
CO2: 25 mmol/L (ref 22–32)
Calcium: 9 mg/dL (ref 8.9–10.3)
Chloride: 100 mmol/L (ref 98–111)
Creatinine, Ser: 0.94 mg/dL (ref 0.44–1.00)
GFR calc Af Amer: >60 mL/min (ref >60)
GFR calc non Af Amer: >60 mL/min (ref >60)
Glucose, Bld: 482 mg/dL — ABNORMAL HIGH (ref 70–99)
Potassium: 3.8 mmol/L (ref 3.5–5.1)
Sodium: 135 mmol/L (ref 135–145)
Total Bilirubin: 0.8 mg/dL (ref 0.3–1.2)
Total Protein: 7.5 g/dL (ref 6.5–8.1)

## 2018-12-08 LAB — TROPONIN I (HIGH SENSITIVITY)
Troponin I (High Sensitivity): <2 ng/L (ref <18)
Troponin I (High Sensitivity): 2 ng/L (ref <18)

## 2018-12-08 LAB — CBC WITH DIFFERENTIAL/PLATELET
Abs Immature Granulocytes: 0.01 10*3/uL (ref 0.00–0.07)
Basophils Absolute: 0 10*3/uL (ref 0.0–0.1)
Basophils Relative: 1 %
Eosinophils Absolute: 0.1 10*3/uL (ref 0.0–0.5)
Eosinophils Relative: 2 %
HCT: 39.8 % (ref 36.0–46.0)
Hemoglobin: 13 g/dL (ref 12.0–15.0)
Immature Granulocytes: 0 %
Lymphocytes Relative: 58 %
Lymphs Abs: 4.3 10*3/uL — ABNORMAL HIGH (ref 0.7–4.0)
MCH: 26.7 pg (ref 26.0–34.0)
MCHC: 32.7 g/dL (ref 30.0–36.0)
MCV: 81.7 fL (ref 80.0–100.0)
Monocytes Absolute: 0.4 10*3/uL (ref 0.1–1.0)
Monocytes Relative: 5 %
Neutro Abs: 2.5 10*3/uL (ref 1.7–7.7)
Neutrophils Relative %: 34 %
Platelets: 234 10*3/uL (ref 150–400)
RBC: 4.87 MIL/uL (ref 3.87–5.11)
RDW: 12.8 % (ref 11.5–15.5)
WBC: 7.4 10*3/uL (ref 4.0–10.5)
nRBC: 0 % (ref 0.0–0.2)

## 2018-12-08 LAB — LIPASE, BLOOD: Lipase: 48 U/L (ref 11–51)

## 2018-12-08 LAB — FIBRIN DERIVATIVES D-DIMER (ARMC ONLY): Fibrin derivatives D-dimer (ARMC): 196.64 ng/mL (FEU) (ref 0.00–499.00)

## 2018-12-08 MED ORDER — FAMOTIDINE IN NACL 20-0.9 MG/50ML-% IV SOLN
20.0000 mg | Freq: Once | INTRAVENOUS | Status: AC
  Administered 2018-12-08: 20 mg via INTRAVENOUS
  Filled 2018-12-08: qty 50

## 2018-12-08 MED ORDER — ONDANSETRON HCL 4 MG/2ML IJ SOLN
4.0000 mg | Freq: Once | INTRAMUSCULAR | Status: AC
  Administered 2018-12-08: 4 mg via INTRAVENOUS
  Filled 2018-12-08: qty 2

## 2018-12-08 MED ORDER — IBUPROFEN 600 MG PO TABS: 600.0000 mg | ORAL_TABLET | Freq: Three times a day (TID) | ORAL | 0 refills | Status: AC | PRN

## 2018-12-08 MED ORDER — SODIUM CHLORIDE 0.9 % IV BOLUS
500.0000 mL | Freq: Once | INTRAVENOUS | Status: AC
  Administered 2018-12-08: 500 mL via INTRAVENOUS

## 2018-12-08 MED ORDER — FENTANYL CITRATE (PF) 100 MCG/2ML IJ SOLN
25.0000 ug | Freq: Once | INTRAMUSCULAR | Status: AC
  Administered 2018-12-08: 25 ug via INTRAVENOUS
  Filled 2018-12-08: qty 2

## 2018-12-08 MED ORDER — HYDROCODONE-ACETAMINOPHEN 5-325 MG PO TABS: 1.0000 | ORAL_TABLET | Freq: Four times a day (QID) | ORAL | 0 refills | Status: DC | PRN

## 2018-12-08 NOTE — ED Notes (Addendum)
Pt reports chest pain for the past three days.  The pain radiates to her back and down to her right breast.  Pt states she came to the ER tonight because she was sleeping and woke up because it felt like something hit her in her chest, and she could feel it all the way to her back. Pt is currently resting and is in NAD.

## 2018-12-08 NOTE — Discharge Instructions (Signed)
1.  You may take pain medicines as needed (Motrin/Norco #15). 2.  Apply moist heat to affected area several times daily. 3.  Talk to your doctor about further work-up of your possible liver hemangioma. 4.  Return to the ER for worsening symptoms, persistent vomiting, difficulty breathing or other concerns.

## 2018-12-08 NOTE — ED Triage Notes (Signed)
Patient ambulatory to triage with steady gait, without difficulty or distress noted, mask in place; pt reports x 3 days having rt upper CP radiating into back accomp by nausea

## 2018-12-08 NOTE — ED Provider Notes (Signed)
[facility] Emergency Department Provider Note   ____________________________________________   First MD Initiated Contact with Patient 12/08/18 [phone]     (approximate)  I have reviewed the triage vital signs and the nursing notes.   HISTORY  Chief Complaint Chest Pain    HPI Rose Thomas is a 48 y.o. female who presents to the ED from home with a chief complaint of chest pain.  Patient reports pain in her right upper quadrant/right lower chest constantly for the past 3 days.  Describes soreness radiating into her back associated with nausea.  Exacerbated by movement and deep breathing.  Denies fever, cough, shortness of breath, vomiting, diarrhea.  Denies recent travel, trauma or hormone use.       Past Medical History:  Diagnosis Date   Diabetes mellitus without complication ([place])    Hypertension     There are no active problems to display for this patient.   Past Surgical History:  Procedure Laterality Date   ADRENAL GLAND SURGERY     removed on right side.   APPENDECTOMY      Prior to Admission medications   Medication Sig Start Date End Date Taking? Authorizing Provider  ibuprofen (ADVIL,MOTRIN) 600 MG tablet Take 1 tablet (600 mg total) by mouth every 6 (six) hours as needed. 03/07/18   Laban Emperor, PA-C  naproxen (NAPROSYN) 500 MG tablet Take 1 tablet (500 mg total) by mouth 2 (two) times daily with a meal. 12/01/14   Sable Feil, PA-C    Allergies Percocet [oxycodone-acetaminophen] and Penicillins  No family history on file.  Social History Social History   Tobacco Use   Smoking status: Never Smoker   Smokeless tobacco: Never Used  Substance Use Topics   Alcohol use: Yes    Comment: occasional   Drug use: Not on file    Review of Systems  Constitutional: No fever/chills Eyes: No visual changes. ENT: No sore throat. Cardiovascular: Positive for right lower chest pain. Respiratory: Denies shortness of  breath. Gastrointestinal: Positive for right upper abdominal pain.  Positive for nausea, no vomiting.  No diarrhea.  No constipation. Genitourinary: Negative for dysuria. Musculoskeletal: Negative for back pain. Skin: Negative for rash. Neurological: Negative for headaches, focal weakness or numbness.   ____________________________________________   PHYSICAL EXAM:  VITAL SIGNS: ED Triage Vitals  Enc Vitals Group     BP 12/08/18 0041 (!) 146/79     Pulse Rate 12/08/18 0041 83     Resp 12/08/18 0041 16     Temp 12/08/18 0041 98.3 F (36.8 C)     Temp Source 12/08/18 0041 Oral     SpO2 12/08/18 0041 100 %     Weight 12/08/18 0031 145 lb 8 oz (66 kg)     Height 12/08/18 0031 5\' 6"  (1.676 m)     Head Circumference --      Peak Flow --      Pain Score 12/08/18 0031 7     Pain Loc --      Pain Edu? --      Excl. in [place]? --     Constitutional: Alert and oriented. Well appearing and in no acute distress. Eyes: Conjunctivae are normal. PERRL. EOMI. Head: Atraumatic. Nose: No congestion/rhinnorhea. Mouth/Throat: Mucous membranes are moist.  Oropharynx non-erythematous. Neck: No stridor.   Cardiovascular: Normal rate, regular rhythm. Grossly normal heart sounds.  Good peripheral circulation. Respiratory: Normal respiratory effort.  No retractions. Lungs CTAB.  Right lower chest tender to palpation.  Gastrointestinal: Soft and mildly tender to palpation right upper quadrant without rebound or guarding. No distention. No abdominal bruits. No CVA tenderness. Musculoskeletal: No lower extremity tenderness nor edema.  No joint effusions. Neurologic:  Normal speech and language. No gross focal neurologic deficits are appreciated. No gait instability. Skin:  Skin is warm, dry and intact. No rash noted.  No vesicles. Psychiatric: Mood and affect are normal. Speech and behavior are normal.  ____________________________________________   LABS (all labs ordered are listed, but only abnormal  results are displayed)  Labs Reviewed  CBC WITH DIFFERENTIAL/PLATELET - Abnormal; Notable for the following components:      Result Value   Lymphs Abs 4.3 (*)    All other components within normal limits  COMPREHENSIVE METABOLIC PANEL - Abnormal; Notable for the following components:   Glucose, Bld 482 (*)    All other components within normal limits  LIPASE, BLOOD  FIBRIN DERIVATIVES D-DIMER ([facility] ONLY)  TROPONIN I (HIGH SENSITIVITY)  TROPONIN I (HIGH SENSITIVITY)   ____________________________________________  EKG  ED ECG REPORT I, Romario Tith J, the attending physician, personally viewed and interpreted this ECG.   Date: 12/08/2018  EKG Time: 0037  Rate: 84  Rhythm: normal EKG, normal sinus rhythm  Axis: LAD  Intervals:none  ST&T Change: Nonspecific  ____________________________________________  RADIOLOGY  ED MD interpretation: No acute cardiopulmonary process; no cholecystitis; likely hemangioma  Official radiology report(s): Dg Chest 2 View  Result Date: 12/08/2018 CLINICAL DATA:  Chest pain on the right for several days EXAM: CHEST - 2 VIEW COMPARISON:  01/31/2017 FINDINGS: Cardiac shadows within normal limits. The lungs are well aerated bilaterally. No focal infiltrate or sizable effusion is seen. No bony abnormality is noted. IMPRESSION: No active cardiopulmonary disease. Electronically Signed   By: Alcide CleverMark  Lukens M.D.   On: 12/08/2018 00:51   [place] Abdomen Limited Ruq  Result Date: 12/08/2018 CLINICAL DATA:  Right upper quadrant and right lower chest pain for 3 days. EXAM: ULTRASOUND ABDOMEN LIMITED RIGHT UPPER QUADRANT COMPARISON:  None. FINDINGS: Gallbladder: No gallstones or wall thickening visualized. No sonographic Murphy sign noted by sonographer. Common bile duct: Diameter: 3.8 mm, within normal limits. Liver: The liver is mildly echogenic. A focal well-defined rounded hyperechoic lesion in the right lobe of the liver measures 2.1 x 1.9 x 1.7 cm. This is not  previously reported. Findings are characteristic of a hemangioma, but nonspecific. Portal vein is patent on color Doppler imaging with normal direction of blood flow towards the liver. Other: None. IMPRESSION: 1. Normal sonographic appearance of the gallbladder and common bile duct. 2. Focal hyperechoic lesion in the right lobe of the liver was not previously reported were seen. Although this likely represents a hemangioma, elective, nonemergent, MRI of the abdomen without and with contrast is recommended for further evaluation a definitive diagnosis. 3. Mild increased echogenicity in the liver likely reflects fatty infiltration. Electronically Signed   By: Marin Robertshristopher  Mattern M.D.   On: 12/08/2018 04:27    ____________________________________________   PROCEDURES  Procedure(s) performed (including Critical Care):  Procedures   ____________________________________________   INITIAL IMPRESSION / ASSESSMENT AND PLAN / ED COURSE  As part of my medical decision making, I reviewed the following data within the electronic MEDICAL RECORD NUMBER Nursing notes reviewed and incorporated, Labs reviewed, EKG interpreted, Old chart reviewed, Radiograph reviewed, Notes from prior ED visits and [place] Controlled Substance Database     Harolyn Rutherfordonia M Montini was evaluated in Emergency Department on 12/08/2018 for the symptoms described in the history of  present illness. She was evaluated in the context of the global COVID-19 pandemic, which necessitated consideration that the patient might be at risk for infection with the SARS-CoV-2 virus that causes COVID-19. Institutional protocols and algorithms that pertain to the evaluation of patients at risk for COVID-19 are in a state of rapid change based on information released by regulatory bodies including the CDC and federal and state organizations. These policies and algorithms were followed during the patient's care in the ED.    48 year old female who presents with a 3-day  history of right lower chest/upper quadrant pain. Differential diagnosis includes, but is not limited to, ACS, aortic dissection, pulmonary embolism, cardiac tamponade, pneumothorax, pneumonia, pericarditis, myocarditis, GI-related causes including esophagitis/gastritis, and musculoskeletal chest wall pain.  Differential diagnosis includes, but is not limited to, biliary disease (biliary colic, acute cholecystitis, cholangitis, choledocholithiasis, etc), intrathoracic causes for epigastric abdominal pain including ACS, gastritis, duodenitis, pancreatitis, small bowel or large bowel obstruction, abdominal aortic aneurysm, hernia, and ulcer(s).  Initial troponin and LFTs unremarkable.  Will repeat times troponin, check D-dimer.  Administer IV analgesia, Pepcid and Zofran.  Obtain right upper quadrant abdominal ultrasound to evaluate for cholecystitis.   Clinical Course as of Dec 08 454  Thu Dec 08, 2018  7169 Repeat troponin and D-dimer unremarkable.  Awaiting results of ultrasound.   [JS]  0455 Pain improved.  Updated patient on ultrasound result.  She will follow-up with her PCP to discuss follow-up MRI to further evaluate possible liver hemangioma.  Will discharge home with NSAIDs and analgesia.  Strict return precautions given.  Patient verbalizes understanding agrees with plan of care.   [JS]    Clinical Course User Index [JS] Irean Hong, MD     ____________________________________________   FINAL CLINICAL IMPRESSION(S) / ED DIAGNOSES  Final diagnoses:  Nonspecific chest pain  Hyperglycemia     ED Discharge Orders    None       Note:  This document was prepared using Dragon voice recognition software and may include unintentional dictation errors.   Irean Hong, MD 12/08/18 [phone]

## 2018-12-23 ENCOUNTER — Telehealth: Payer: BC Managed Care – PPO | Admitting: Family

## 2018-12-23 DIAGNOSIS — Z20822 Contact with and (suspected) exposure to covid-19: Secondary | ICD-10-CM

## 2018-12-23 NOTE — Progress Notes (Signed)
E-Visit for .Corona. Virus Screening   Your current symptoms could be consistent with the coronavirus.  Many health care providers can now test patients at their office but not all are.  [facility] has multiple testing sites. For information on our COVID testing locations and hours go to [email or web address]  Please quarantine yourself while awaiting your test results.  We are enrolling you in our MyChart Home [place] for COVID19 . Daily you will receive a questionnaire within the MyChart website. Our COVID 19 response team willl be monitoriing your responses daily. Please continue good preventive care measures, including:  frequent hand-washing, avoid touching your face, cover coughs/sneezes, stay out of crowds and keep a 6 foot distance from others.    COVID-19 is a respiratory illness with symptoms that are similar to the flu. Symptoms are typically mild to moderate, but there have been cases of severe illness and death due to the virus. The following symptoms may appear 2-14 days after exposure: . Fever . Cough . Shortness of breath or difficulty breathing . Chills . Repeated shaking with chills . Muscle pain . Headache . Sore throat . New loss of taste or smell . Fatigue . Congestion or runny nose . Nausea or vomiting . Diarrhea  If you develop fever/cough/breathlessness, please stay home for 10 days with improving symptoms and until you have had 24 hours of no fever (without taking a fever reducer).  Go to the nearest hospital ED for assessment if fever/cough/breathlessness are severe or illness seems like a threat to life.  It is vitally important that if you feel that you have an infection such as this virus or any other virus that you stay home and away from places where you may spread it to others.  You should avoid contact with people age 65 and older.   You should wear a mask or cloth face covering over your nose and mouth if you must be around other  people or animals, including pets (even at home). Try to stay at least 6 feet away from other people. This will protect the people around you.  You may also take acetaminophen (Tylenol) as needed for fever.   Reduce your risk of any infection by using the same precautions used for avoiding the common cold or flu:  . Wash your hands often with soap and warm water for at least 20 seconds.  If soap and water are not readily available, use an alcohol-based hand sanitizer with at least 60% alcohol.  . If coughing or sneezing, cover your mouth and nose by coughing or sneezing into the elbow areas of your shirt or coat, into a tissue or into your sleeve (not your hands). . Avoid shaking hands with others and consider head nods or verbal greetings only. . Avoid touching your eyes, nose, or mouth with unwashed hands.  . Avoid close contact with people who are sick. . Avoid places or events with large numbers of people in one location, like concerts or sporting events. . Carefully consider travel plans you have or are making. . If you are planning any travel outside or inside the [place], visit the CDC's Travelers' Health webpage for the latest health notices. . If you have some symptoms but not all symptoms, continue to monitor at home and seek medical attention if your symptoms worsen. . If you are having a medical emergency, call 911.  HOME CARE . Only take medications as instructed by your medical team. . Drink plenty of fluids and get   plenty of rest. . A steam or ultrasonic humidifier can help if you have congestion.   GET HELP RIGHT AWAY IF YOU HAVE EMERGENCY WARNING SIGNS** FOR COVID-19. If you or someone is showing any of these signs seek emergency medical care immediately. Call 911 or proceed to your closest emergency facility if: . You develop worsening high fever. . Trouble breathing . Bluish lips or face . Persistent pain or pressure in the chest . New confusion . Inability to wake or stay  awake . You cough up blood. . Your symptoms become more severe  **This list is not all possible symptoms. Contact your medical provider for any symptoms that are sever or concerning to you.   MAKE SURE YOU   Understand these instructions.  Will watch your condition.  Will get help right away if you are not doing well or get worse.  Your e-visit answers were reviewed by a board certified advanced clinical practitioner to complete your personal care plan.  Depending on the condition, your plan could have included both over the counter or prescription medications.  If there is a problem please reply once you have received a response from your provider.  Your safety is important to us.  If you have drug allergies check your prescription carefully.    You can use MyChart to ask questions about today's visit, request a non-urgent call back, or ask for a work or school excuse for 24 hours related to this e-Visit. If it has been greater than 24 hours you will need to follow up with your provider, or enter a new e-Visit to address those concerns. You will get an e-mail in the next two days asking about your experience.  I hope that your e-visit has been valuable and will speed your recovery. Thank you for using e-visits.   Greater than 5 minutes, yet less than 10 minutes of time have been spent researching, coordinating, and implementing care for this patient today.  Thank you for the details you included in the comment boxes. Those details are very helpful in determining the best course of treatment for you and help us to provide the best care.  

## 2019-01-31 DIAGNOSIS — N760 Acute vaginitis: Secondary | ICD-10-CM | POA: Insufficient documentation

## 2019-05-17 ENCOUNTER — Encounter: Payer: Self-pay | Admitting: Emergency Medicine

## 2019-05-17 ENCOUNTER — Other Ambulatory Visit: Payer: Self-pay

## 2019-05-17 ENCOUNTER — Emergency Department: Payer: BC Managed Care – PPO

## 2019-05-17 DIAGNOSIS — Z5321 Procedure and treatment not carried out due to patient leaving prior to being seen by health care provider: Secondary | ICD-10-CM | POA: Diagnosis not present

## 2019-05-17 DIAGNOSIS — R110 Nausea: Secondary | ICD-10-CM | POA: Diagnosis not present

## 2019-05-17 DIAGNOSIS — R0789 Other chest pain: Secondary | ICD-10-CM | POA: Diagnosis present

## 2019-05-17 LAB — COMPREHENSIVE METABOLIC PANEL
ALT: 20 U/L (ref 0–44)
AST: 18 U/L (ref 15–41)
Albumin: 4.2 g/dL (ref 3.5–5.0)
Alkaline Phosphatase: 85 U/L (ref 38–126)
Anion gap: 10 (ref 5–15)
BUN: 13 mg/dL (ref 6–20)
CO2: 27 mmol/L (ref 22–32)
Calcium: 9.2 mg/dL (ref 8.9–10.3)
Chloride: 101 mmol/L (ref 98–111)
Creatinine, Ser: 1.02 mg/dL — ABNORMAL HIGH (ref 0.44–1.00)
GFR calc Af Amer: >60 mL/min (ref >60)
GFR calc non Af Amer: >60 mL/min (ref >60)
Glucose, Bld: 379 mg/dL — ABNORMAL HIGH (ref 70–99)
Potassium: 3.6 mmol/L (ref 3.5–5.1)
Sodium: 138 mmol/L (ref 135–145)
Total Bilirubin: 0.7 mg/dL (ref 0.3–1.2)
Total Protein: 7.7 g/dL (ref 6.5–8.1)

## 2019-05-17 LAB — CBC WITH DIFFERENTIAL/PLATELET
Abs Immature Granulocytes: 0.02 10*3/uL (ref 0.00–0.07)
Basophils Absolute: 0 10*3/uL (ref 0.0–0.1)
Basophils Relative: 1 %
Eosinophils Absolute: 0.2 10*3/uL (ref 0.0–0.5)
Eosinophils Relative: 2 %
HCT: 42.3 % (ref 36.0–46.0)
Hemoglobin: 14 g/dL (ref 12.0–15.0)
Immature Granulocytes: 0 %
Lymphocytes Relative: 47 %
Lymphs Abs: 4.1 10*3/uL — ABNORMAL HIGH (ref 0.7–4.0)
MCH: 27.3 pg (ref 26.0–34.0)
MCHC: 33.1 g/dL (ref 30.0–36.0)
MCV: 82.5 fL (ref 80.0–100.0)
Monocytes Absolute: 0.5 10*3/uL (ref 0.1–1.0)
Monocytes Relative: 6 %
Neutro Abs: 3.7 10*3/uL (ref 1.7–7.7)
Neutrophils Relative %: 44 %
Platelets: 227 10*3/uL (ref 150–400)
RBC: 5.13 MIL/uL — ABNORMAL HIGH (ref 3.87–5.11)
RDW: 12.4 % (ref 11.5–15.5)
WBC: 8.5 10*3/uL (ref 4.0–10.5)
nRBC: 0.2 % (ref 0.0–0.2)

## 2019-05-17 LAB — TROPONIN I (HIGH SENSITIVITY)
Troponin I (High Sensitivity): 3 ng/L (ref <18)
Troponin I (High Sensitivity): 3 ng/L (ref <18)

## 2019-05-17 NOTE — ED Triage Notes (Signed)
Patient ambulatory to triage with steady gait, without difficulty or distress noted, mask in place; voice hoarseness noted (pt reports from her allergies) pt reports x wk having mid CP radiating into rt arm and into back accomp by nausea; st seen at urgent care recently and told she had "abnormal t wave and to f/u with cardiology"; has appt for stress test in May (Dr Juliann Pares)

## 2019-05-18 ENCOUNTER — Emergency Department
Admission: EM | Admit: 2019-05-18 | Discharge: 2019-05-18 | Disposition: A | Discharge status: Home / Self Care | Payer: BC Managed Care – PPO | Attending: Emergency Medicine | Admitting: Emergency Medicine

## 2019-05-19 ENCOUNTER — Telehealth: Payer: Self-pay | Admitting: Emergency Medicine

## 2019-05-19 NOTE — Telephone Encounter (Signed)
Ms Currin called me back and says she continues to have some pain, but not as bad as it was.  I asked her to contact her cardiologist or pcp to inform them of the continued pain and have them review the labwork.  She says she will call her cardiologist.

## 2019-05-19 NOTE — Telephone Encounter (Signed)
Called patient due to lwot to inquire about condition and follow up plans.No answer and voicemail is full. °

## 2019-12-27 ENCOUNTER — Emergency Department
Admission: EM | Admit: 2019-12-27 | Discharge: 2019-12-27 | Disposition: A | Discharge status: Home / Self Care | Payer: BC Managed Care – PPO | Attending: Emergency Medicine | Admitting: Emergency Medicine

## 2019-12-27 ENCOUNTER — Other Ambulatory Visit: Payer: Self-pay

## 2019-12-27 DIAGNOSIS — Y92481 Parking lot as the place of occurrence of the external cause: Secondary | ICD-10-CM | POA: Diagnosis not present

## 2019-12-27 DIAGNOSIS — R200 Anesthesia of skin: Secondary | ICD-10-CM | POA: Diagnosis not present

## 2019-12-27 DIAGNOSIS — I10 Essential (primary) hypertension: Secondary | ICD-10-CM | POA: Insufficient documentation

## 2019-12-27 DIAGNOSIS — M542 Cervicalgia: Secondary | ICD-10-CM | POA: Insufficient documentation

## 2019-12-27 DIAGNOSIS — E119 Type 2 diabetes mellitus without complications: Secondary | ICD-10-CM | POA: Diagnosis not present

## 2019-12-27 MED ORDER — METHOCARBAMOL 500 MG PO TABS
750.0000 mg | ORAL_TABLET | Freq: Once | ORAL | Status: AC
  Administered 2019-12-27: 750 mg via ORAL
  Filled 2019-12-27: qty 2

## 2019-12-27 MED ORDER — ACETAMINOPHEN 500 MG PO TABS
1000.0000 mg | ORAL_TABLET | Freq: Once | ORAL | Status: AC
  Administered 2019-12-27: 1000 mg via ORAL
  Filled 2019-12-27: qty 2

## 2019-12-27 MED ORDER — MELOXICAM 15 MG PO TABS: 15.0000 mg | ORAL_TABLET | Freq: Every day | ORAL | 0 refills | Status: AC

## 2019-12-27 MED ORDER — METHOCARBAMOL 750 MG PO TABS: 750.0000 mg | ORAL_TABLET | Freq: Four times a day (QID) | ORAL | 0 refills | Status: AC | PRN

## 2019-12-27 MED ORDER — KETOROLAC TROMETHAMINE 60 MG/2ML IM SOLN
30.0000 mg | Freq: Once | INTRAMUSCULAR | Status: AC
  Administered 2019-12-27: 30 mg via INTRAMUSCULAR
  Filled 2019-12-27: qty 2
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
## 2020-03-06 IMAGING — DX DG FOOT COMPLETE 3+V*R*
3 series · 3 of 3 positions shown · non-contrast
Comparison: None.

CLINICAL DATA: Right foot pain mainly at heel

EXAM:
RIGHT FOOT COMPLETE - 3+ VIEW

[foot ap]
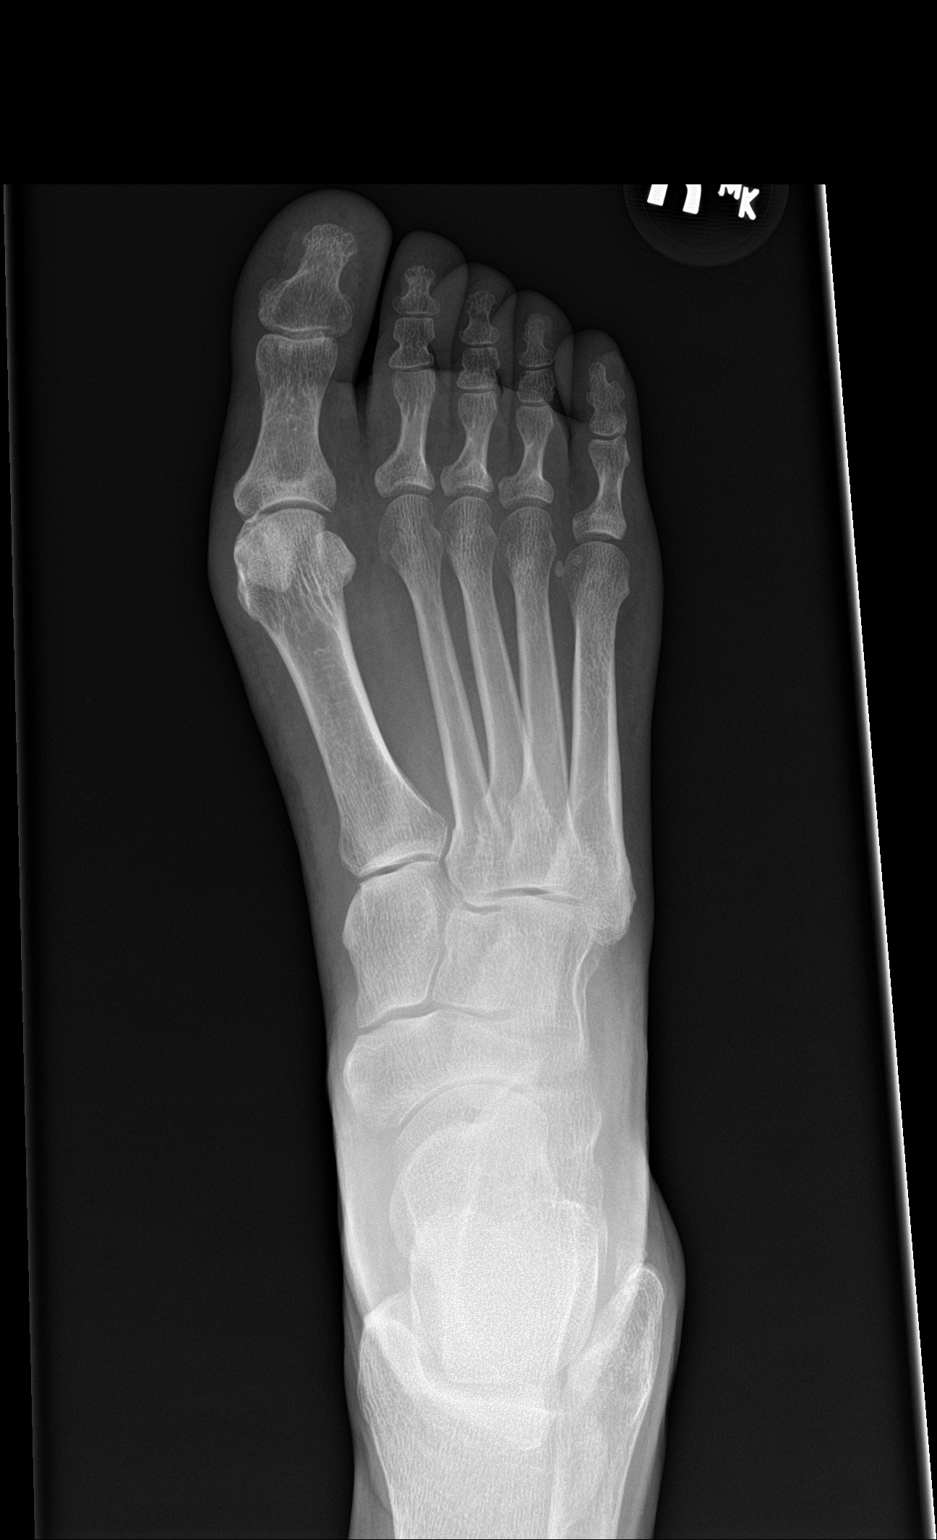

[foot obl]
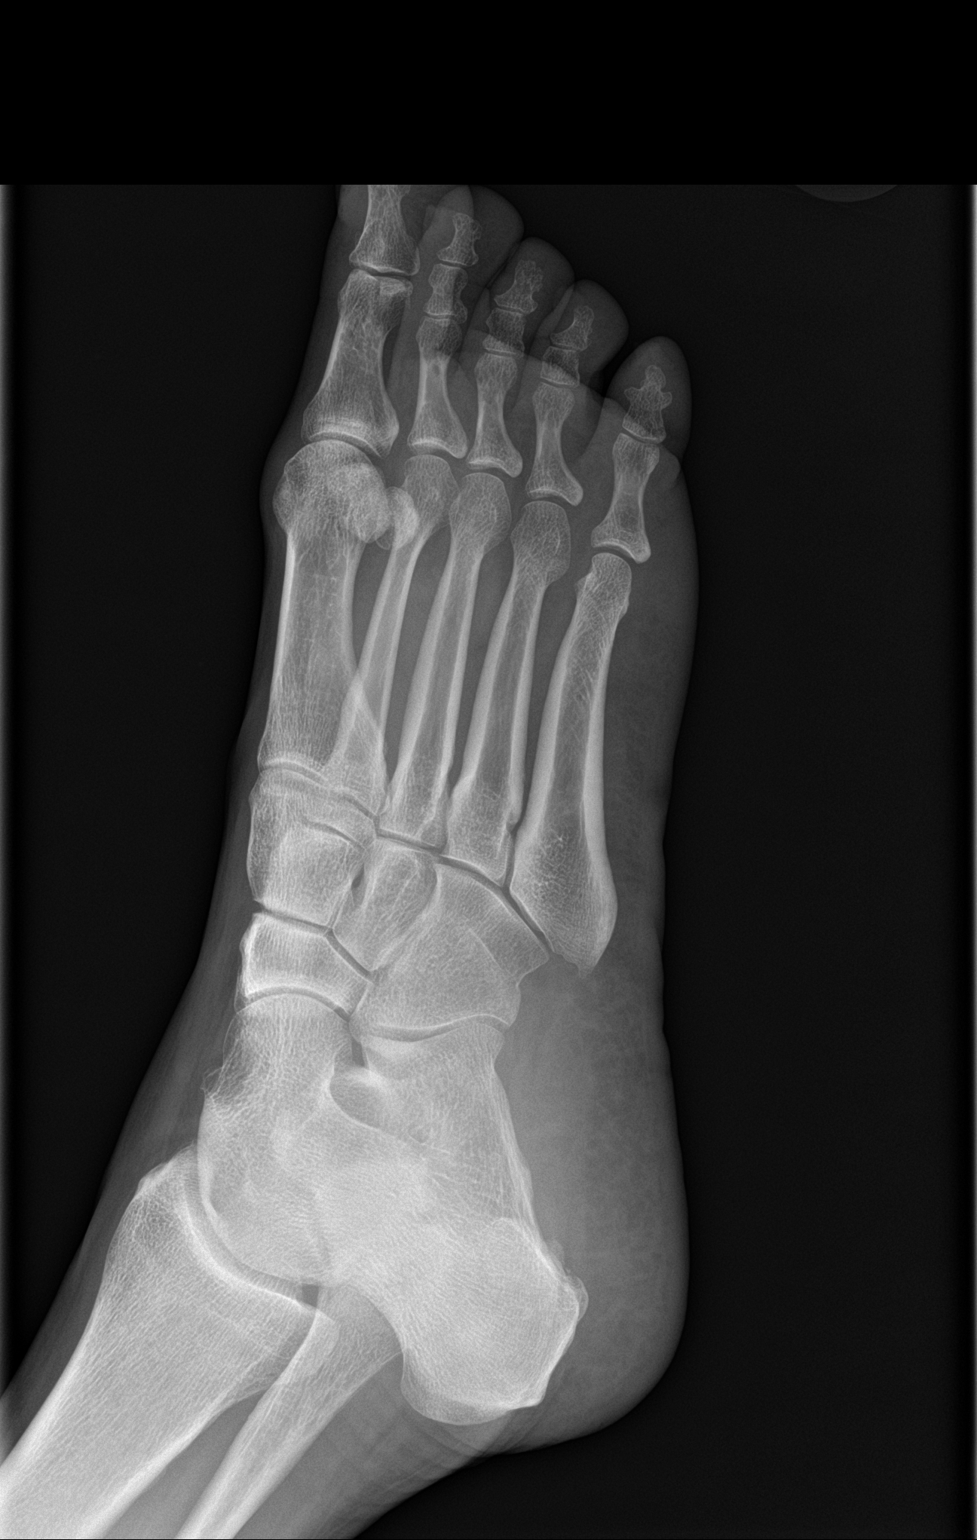

[foot lat]
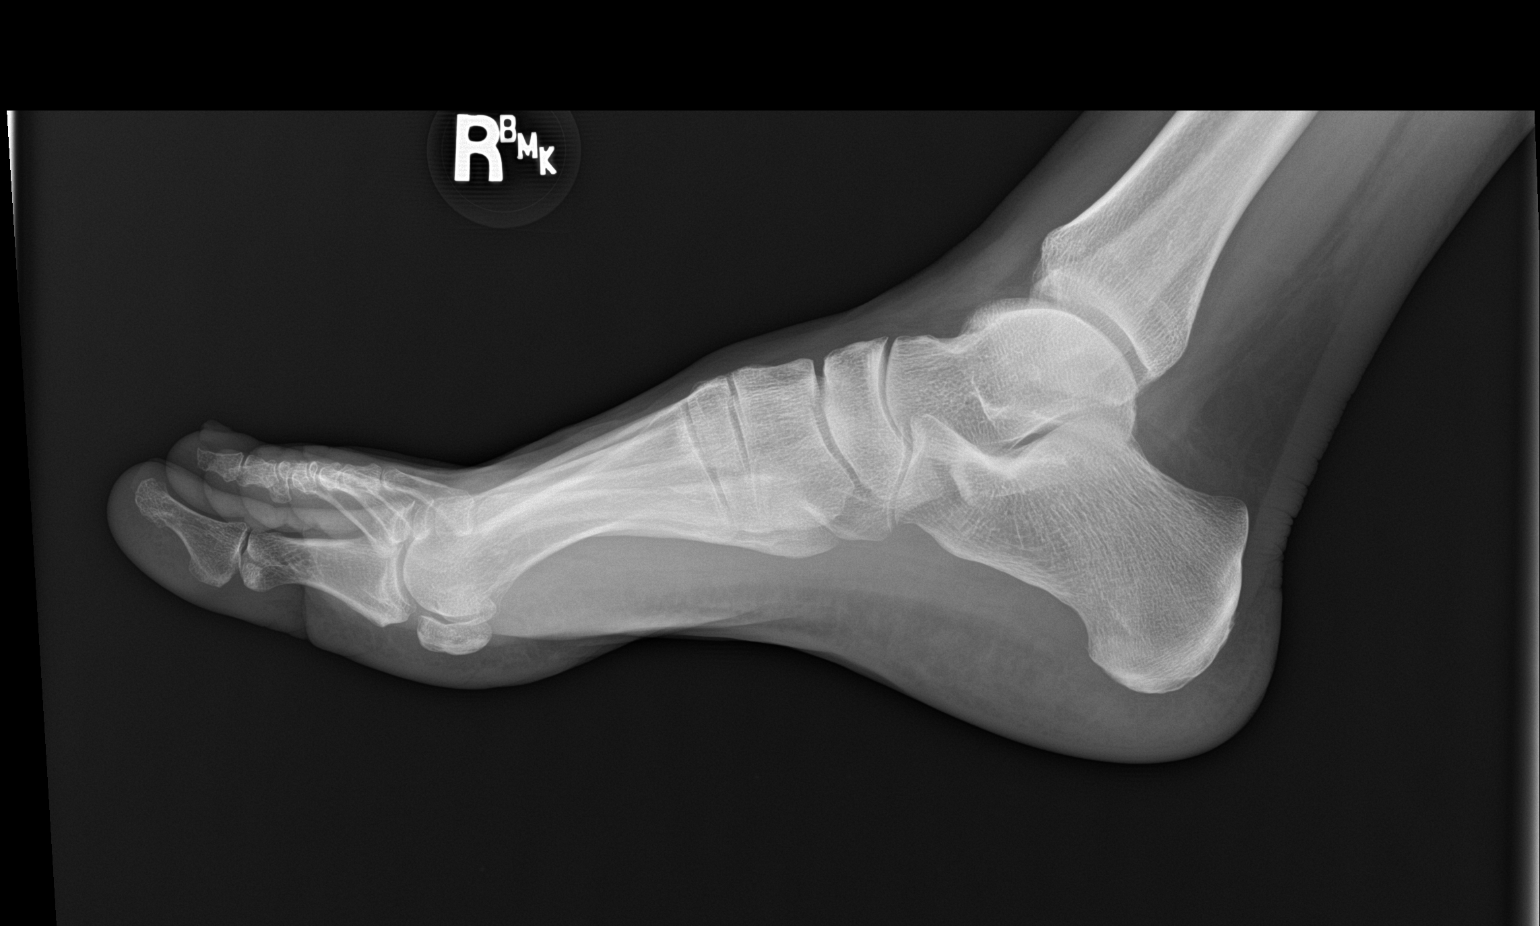

[3 of 3 positions shown; findings below may reference images not displayed]

FINDINGS: There is no evidence of fracture or dislocation. There is no
evidence of arthropathy or other focal bone abnormality. Soft
tissues are unremarkable.
IMPRESSION: Negative.

## 2020-03-22 DIAGNOSIS — R200 Anesthesia of skin: Secondary | ICD-10-CM | POA: Insufficient documentation

## 2020-12-07 IMAGING — US US ABDOMEN LIMITED
1 series · 14 of 25 positions shown · non-contrast
Comparison: None.

CLINICAL DATA: Right upper quadrant and right lower chest pain for
3 days.

EXAM:
ULTRASOUND ABDOMEN LIMITED RIGHT UPPER QUADRANT

[Series 1: us abdomen limited · 0.24mm/px · 14 of 46 slices shown]
[im 1/46]
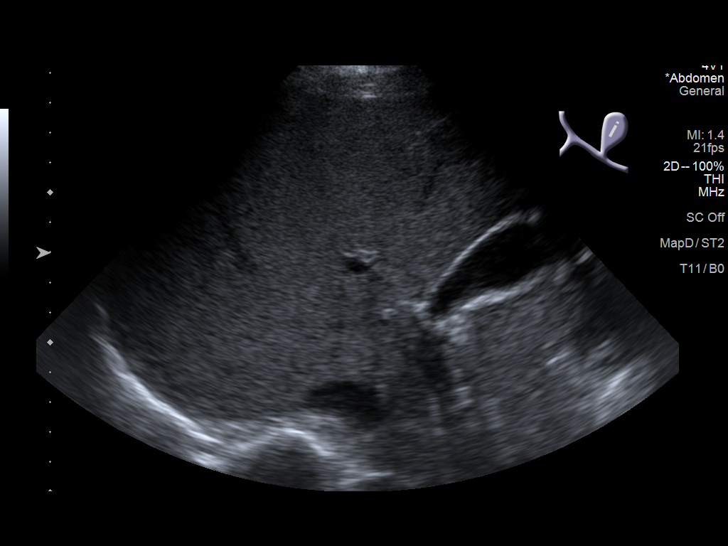
[im 4/46]
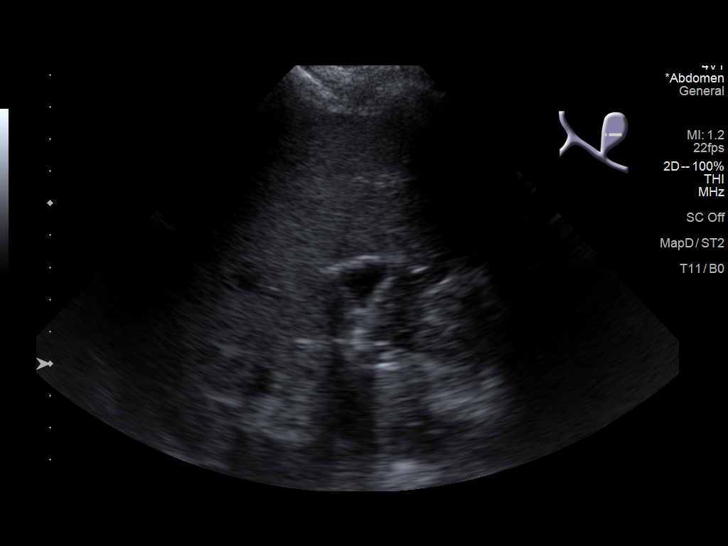
[im 8/46]
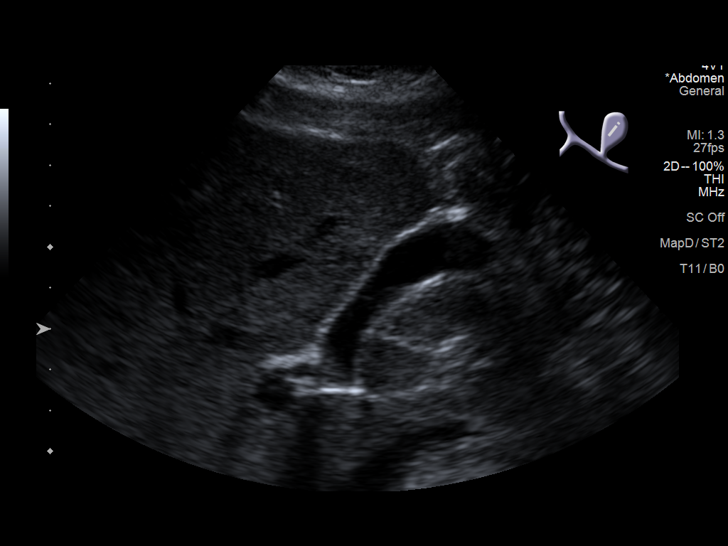
[im 12/46]
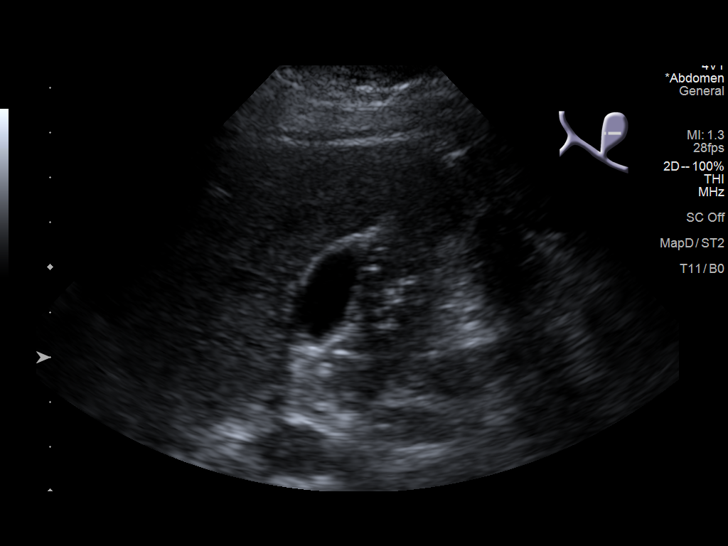
[im 16/46]
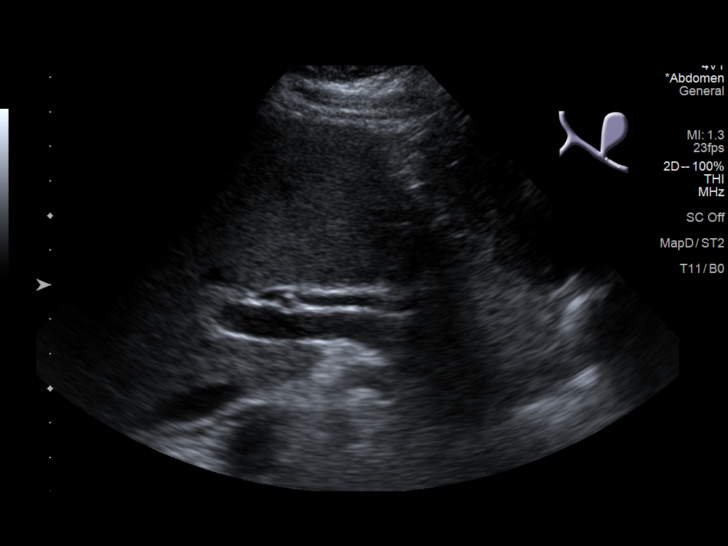
[im 17/46]
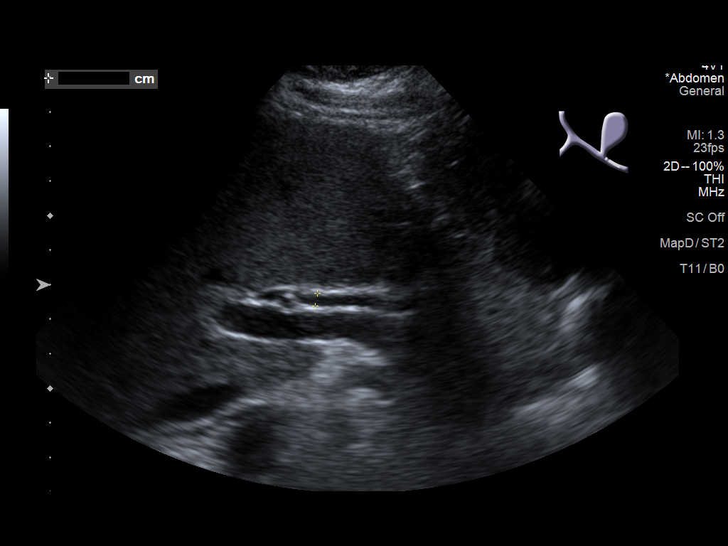
[im 21/46]
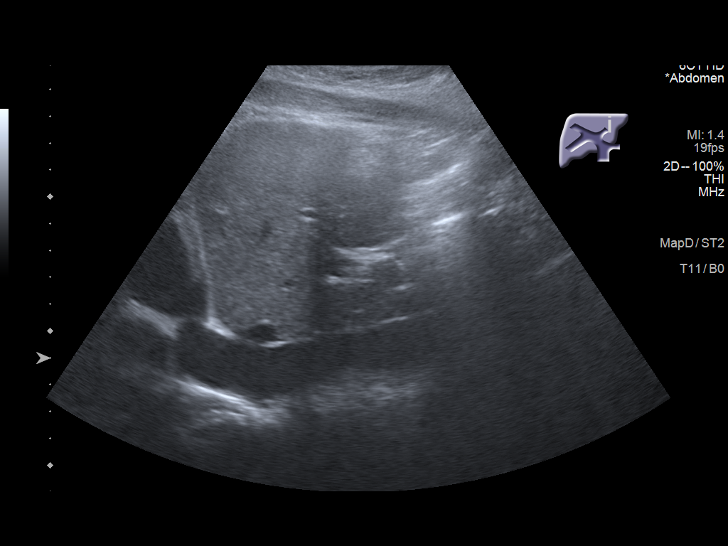
[im 25/46]
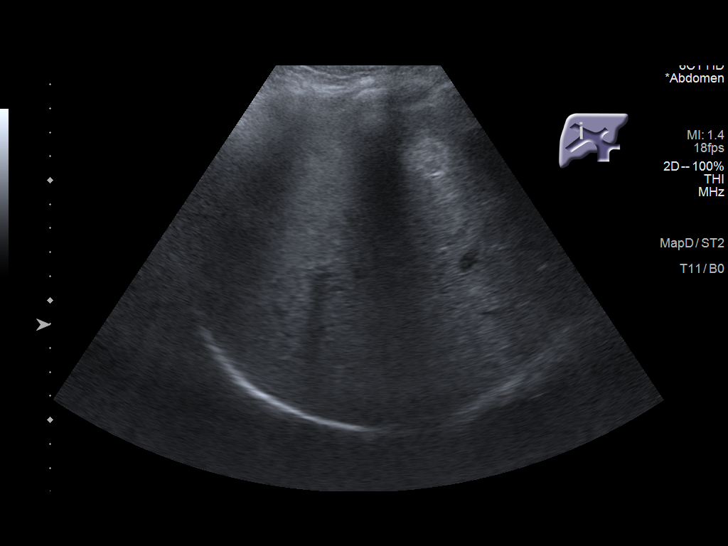
[im 29/46]
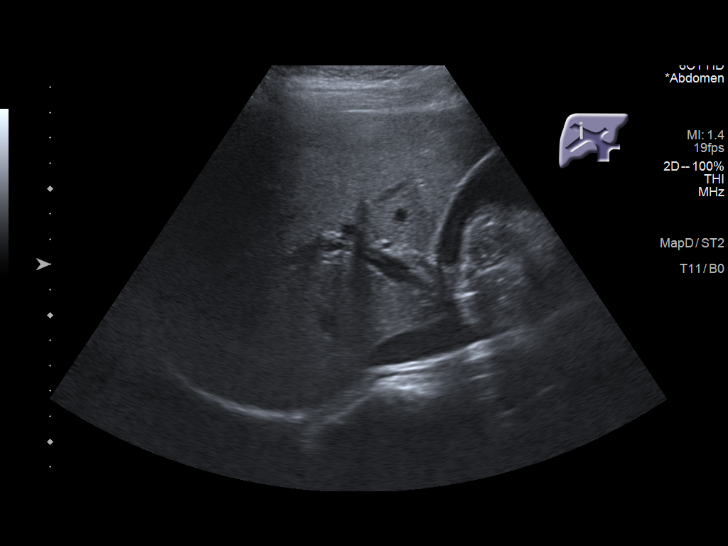
[im 31/46]
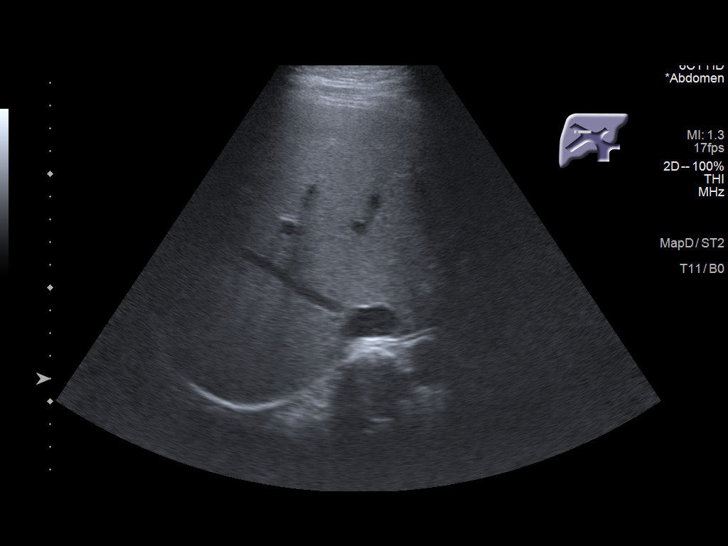
[im 34/46]
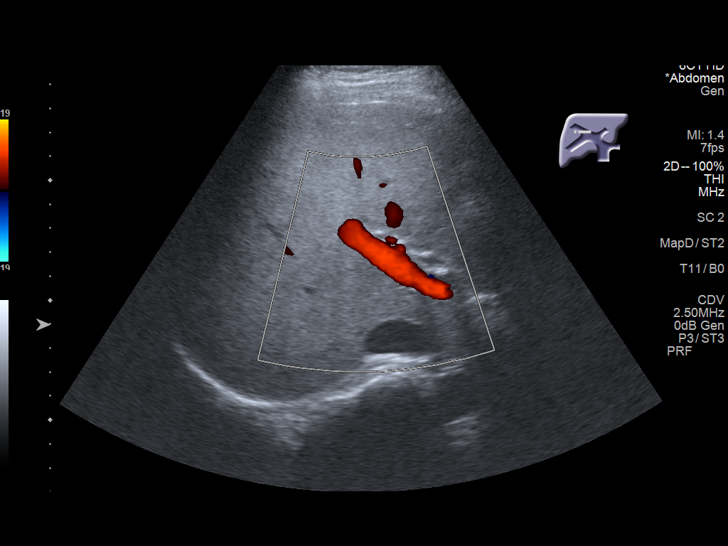
[im 38/46]
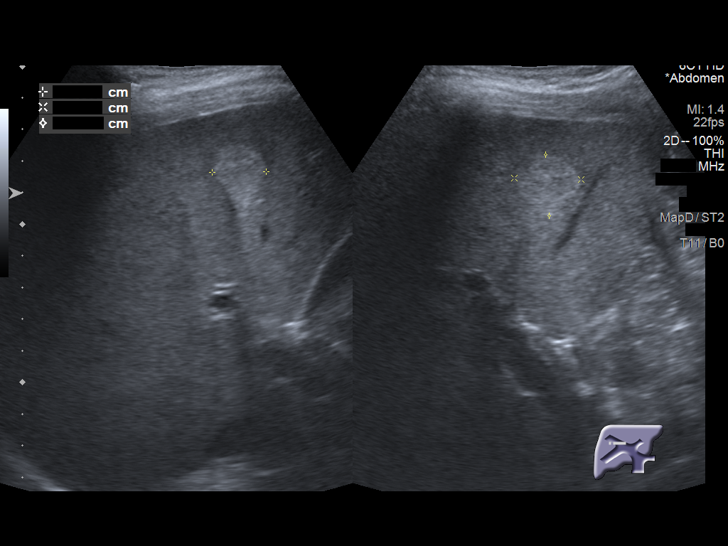
[im 42/46]
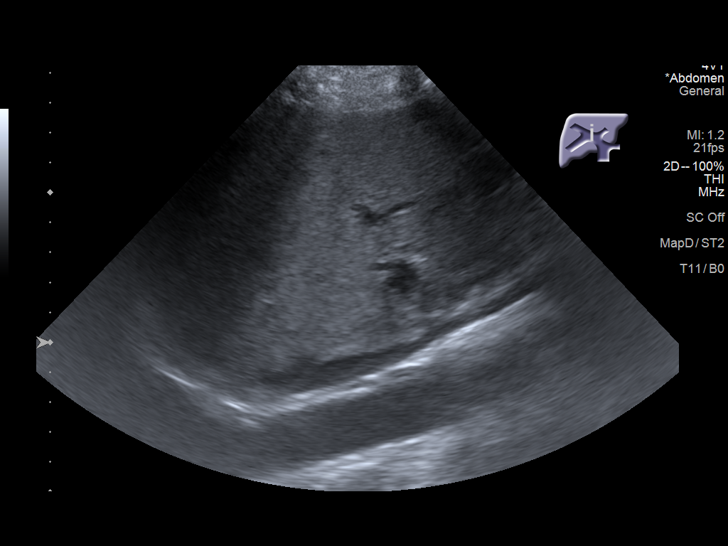
[im 46/46]
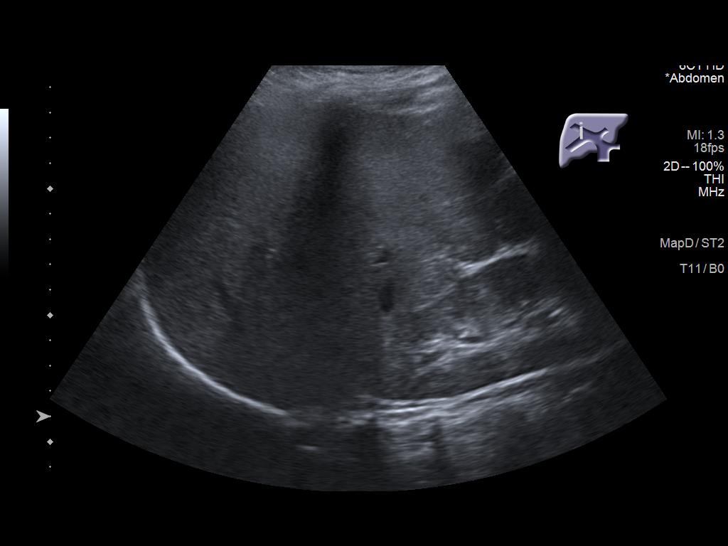

[14 of 25 positions shown; findings below may reference images not displayed]

FINDINGS: Gallbladder:

No gallstones or wall thickening visualized. No sonographic Murphy
sign noted by sonographer.

Common bile duct:

Diameter: 3.8 mm, within normal limits.

Liver:

The liver is mildly echogenic. A focal well-defined rounded
hyperechoic lesion in the right lobe of the liver measures 2.1 x
x 1.7 cm. This is not previously reported. Findings are
characteristic of a hemangioma, but nonspecific. Portal vein is
patent on color Doppler imaging with normal direction of blood flow
towards the liver.

Other: None.
IMPRESSION: 1. Normal sonographic appearance of the gallbladder and common bile
duct.
2. Focal hyperechoic lesion in the right lobe of the liver was not
previously reported were seen. Although this likely represents a
hemangioma, elective, nonemergent, MRI of the abdomen without and
with contrast is recommended for further evaluation a definitive
diagnosis.
3. Mild increased echogenicity in the liver likely reflects fatty
infiltration.

## 2023-02-08 ENCOUNTER — Telehealth: Payer: 59 | Admitting: Physician Assistant

## 2023-02-08 DIAGNOSIS — H109 Unspecified conjunctivitis: Secondary | ICD-10-CM

## 2023-02-08 MED ORDER — MOXIFLOXACIN HCL 0.5 % OP SOLN: 1.0000 [drp] | Freq: Three times a day (TID) | OPHTHALMIC | 0 refills | Status: DC

## 2023-02-08 NOTE — Patient Instructions (Signed)
 Rose Thomas, thank you for joining Delon CHRISTELLA Dickinson, PA-C for today's virtual visit.  While this provider is not your primary care provider (PCP), if your PCP is located in our provider database this encounter information will be shared with them immediately following your visit.   A [facility] MyChart account gives you access to today's visit and all your visits, tests, and labs performed at [facility]  click here if you don't have a [facility] MyChart account or go to [email or web address]  Consent: (Patient) Rose Thomas provided verbal consent for this virtual visit at the beginning of the encounter.  Current Medications:  Current Outpatient Medications:    moxifloxacin  (VIGAMOX ) 0.5 % ophthalmic solution, Place 1 drop into the left eye 3 (three) times daily. For 5 days, Disp: 3 mL, Rfl: 0   HYDROcodone -acetaminophen  (NORCO) 5-325 MG tablet, Take 1 tablet by mouth every 6 (six) hours as needed for moderate pain., Disp: 15 tablet, Rfl: 0   ibuprofen  (ADVIL ) 600 MG tablet, Take 1 tablet (600 mg total) by mouth every 8 (eight) hours as needed., Disp: 15 tablet, Rfl: 0   ibuprofen  (ADVIL ,MOTRIN ) 600 MG tablet, Take 1 tablet (600 mg total) by mouth every 6 (six) hours as needed., Disp: 30 tablet, Rfl: 0   Medications ordered in this encounter:  Meds ordered this encounter  Medications   moxifloxacin  (VIGAMOX ) 0.5 % ophthalmic solution    Sig: Place 1 drop into the left eye 3 (three) times daily. For 5 days    Dispense:  3 mL    Refill:  0    Supervising Provider:   BLAISE ALEENE KIDD [[id number]]     *If you need refills on other medications prior to your next appointment, please contact your pharmacy*  Follow-Up: Call back or seek an in-person evaluation if the symptoms worsen or if the condition fails to improve as anticipated.  [facility] [phone]  Other Instructions Bacterial Conjunctivitis, Adult Bacterial conjunctivitis is an  infection of the clear membrane that covers the white part of the eye and the inner surface of the eyelid (conjunctiva). When the blood vessels in the conjunctiva become inflamed, the eye becomes red or pink. The eye often feels irritated or itchy. Bacterial conjunctivitis spreads easily from person to person (is contagious). It also spreads easily from one eye to the other eye. What are the causes? This condition is caused by bacteria. You may get the infection if you come into close contact with: A person who is infected with the bacteria. Items that are contaminated with the bacteria, such as a face towel, contact lens solution, or eye makeup. What increases the risk? You are more likely to develop this condition if: You are exposed to other people who have the infection. You wear contact lenses. You have a sinus infection. You have had a recent eye injury or surgery. You have a weak body defense system (immune system). You have a medical condition that causes dry eyes. What are the signs or symptoms? Symptoms of this condition include: Thick, yellowish discharge from the eye. This may turn into a crust on the eyelid overnight and cause your eyelids to stick together. Tearing or watery eyes. Itchy eyes. Burning feeling in your eyes. Eye redness. Swollen eyelids. Blurred vision. How is this diagnosed? This condition is diagnosed based on your symptoms and medical history. Your health care provider may also take a sample of discharge from your eye to find the cause of your  infection. How is this treated? This condition may be treated with: Antibiotic eye drops or ointment to clear the infection more quickly and prevent the spread of infection to others. Antibiotic medicines taken by mouth (orally) to treat infections that do not respond to drops or ointments or that last longer than 10 days. Cool, wet cloths (cool compresses) placed on the eyes. Artificial tears applied 2-6 times a  day. Follow these instructions at home: Medicines Take or apply your antibiotic medicine as told by your health care provider. Do not stop using the antibiotic, even if your condition improves, unless directed by your health care provider. Take or apply over-the-counter and prescription medicines only as told by your health care provider. Be very careful to avoid touching the edge of your eyelid with the eye-drop bottle or the ointment tube when you apply medicines to the affected eye. This will keep you from spreading the infection to your other eye or to other people. Managing discomfort Gently wipe away any drainage from your eye with a warm, wet washcloth or a cotton ball. Apply a clean, cool compress to your eye for 10-20 minutes, 3-4 times a day. General instructions Do not wear contact lenses until the inflammation is gone and your health care provider says it is safe to wear them again. Ask your health care provider how to sterilize or replace your contact lenses before you use them again. Wear glasses until you can resume wearing contact lenses. Avoid wearing eye makeup until the inflammation is gone. Throw away any old eye cosmetics that may be contaminated. Change or wash your pillowcase every day. Do not share towels or washcloths. This may spread the infection. Wash your hands often with soap and water for at least 20 seconds and especially before touching your face or eyes. Use paper towels to dry your hands. Avoid touching or rubbing your eyes. Do not drive or use heavy machinery if your vision is blurred. Contact a health care provider if: You have a fever. Your symptoms do not get better after 10 days. Get help right away if: You have a fever and your symptoms suddenly get worse. You have severe pain when you move your eye. You have facial pain, redness, or swelling. You have a sudden loss of vision. Summary Bacterial conjunctivitis is an infection of the clear membrane  that covers the white part of the eye and the inner surface of the eyelid (conjunctiva). Bacterial conjunctivitis spreads easily from eye to eye and from person to person (is contagious). Wash your hands often with soap and water for at least 20 seconds and especially before touching your face or eyes. Use paper towels to dry your hands. Take or apply your antibiotic medicine as told by your health care provider. Do not stop using the antibiotic even if your condition improves. Contact a health care provider if you have a fever or if your symptoms do not get better after 10 days. Get help right away if you have a sudden loss of vision. This information is not intended to replace advice given to you by your health care provider. Make sure you discuss any questions you have with your health care provider. Document Revised: 04/24/2020 Document Reviewed: 04/24/2020 Elsevier Patient Education  2024 Elsevier Inc.    If you have been instructed to have an in-person evaluation today at a local Urgent Care facility, please use the link below. It will take you to a list of all of our available [facility]  Urgent Cares, including address, phone number and hours of operation. Please do not delay care.  [facility]  If you or a family member do not have a primary care provider, use the link below to schedule a visit and establish care. When you choose a [facility] primary care physician or advanced practice provider, you gain a long-term partner in health. Find a Primary Care Provider  Learn more about [facility]'s in-office and virtual care options:  - Get Care Now

## 2023-02-08 NOTE — Progress Notes (Signed)
 Virtual Visit Consent   Rose Thomas, you are scheduled for a virtual visit with a [facility] provider today. Just as with appointments in the office, your consent must be obtained to participate. Your consent will be active for this visit and any virtual visit you may have with one of our providers in the next 365 days. If you have a MyChart account, a copy of this consent can be sent to you electronically.  As this is a virtual visit, video technology does not allow for your provider to perform a traditional examination. This may limit your provider's ability to fully assess your condition. If your provider identifies any concerns that need to be evaluated in person or the need to arrange testing (such as labs, EKG, etc.), we will make arrangements to do so. Although advances in technology are sophisticated, we cannot ensure that it will always work on either your end or our end. If the connection with a video visit is poor, the visit may have to be switched to a telephone visit. With either a video or telephone visit, we are not always able to ensure that we have a secure connection.  By engaging in this virtual visit, you consent to the provision of healthcare and authorize for your insurance to be billed (if applicable) for the services provided during this visit. Depending on your insurance coverage, you may receive a charge related to this service.  I need to obtain your verbal consent now. Are you willing to proceed with your visit today? Rose Thomas has provided verbal consent on 02/08/2023 for a virtual visit (video or telephone). Rose CHRISTELLA Dickinson, PA-C  Date: 02/08/2023 8:45 AM  Virtual Visit via Video Note   I, Rose Thomas, connected with  Rose Thomas  ([id number], 1971-01-18) on 02/08/23 at  8:30 AM EST by a video-enabled telemedicine application and verified that I am speaking with the correct person using two identifiers.  Location: Patient: Virtual Visit Location  Patient: Home Provider: Virtual Visit Location Provider: Home Office   I discussed the limitations of evaluation and management by telemedicine and the availability of in person appointments. The patient expressed understanding and agreed to proceed.    History of Present Illness: Rose Thomas is a 53 y.o. who identifies as a female who was assigned female at birth, and is being seen today for possible eye infection.  HPI: Conjunctivitis  The current episode started today. The onset was sudden. The problem occurs continuously. The problem has been unchanged. The problem is mild. Nothing relieves the symptoms. Nothing aggravates the symptoms. Associated symptoms include eye itching, congestion and URI (improving). Pertinent negatives include no fever, no decreased vision, no double vision, no eye discharge, no eye pain and no eye redness. The eye pain is mild. The left eye is affected. The eye pain is not associated with movement.  She does use prednisone eye drops in left eye    Problems: There are no active problems to display for this patient.   Allergies:  Allergies  Allergen Reactions   Percocet [Oxycodone-Acetaminophen ] Nausea And Vomiting   Penicillins Rash   Medications:  Current Outpatient Medications:    moxifloxacin  (VIGAMOX ) 0.5 % ophthalmic solution, Place 1 drop into the left eye 3 (three) times daily. For 5 days, Disp: 3 mL, Rfl: 0   HYDROcodone -acetaminophen  (NORCO) 5-325 MG tablet, Take 1 tablet by mouth every 6 (six) hours as needed for moderate pain., Disp: 15 tablet, Rfl: 0   ibuprofen  (  ADVIL ) 600 MG tablet, Take 1 tablet (600 mg total) by mouth every 8 (eight) hours as needed., Disp: 15 tablet, Rfl: 0   ibuprofen  (ADVIL ,MOTRIN ) 600 MG tablet, Take 1 tablet (600 mg total) by mouth every 6 (six) hours as needed., Disp: 30 tablet, Rfl: 0  Observations/Objective: Patient is well-developed, well-nourished in no acute distress.  Resting comfortably at home.  Head is  normocephalic, atraumatic.  No labored breathing.  Speech is clear and coherent with logical content.  Patient is alert and oriented at baseline.    Assessment and Plan: 1. Bacterial conjunctivitis of left eye (Primary) - moxifloxacin  (VIGAMOX ) 0.5 % ophthalmic solution; Place 1 drop into the left eye 3 (three) times daily. For 5 days  Dispense: 3 mL; Refill: 0  - Suspect bacterial conjunctivitis - Vigamox  prescribed - Warm compresses - Good hand hygiene - Seek in person evaluation if symptoms worsen or fail to improve   Follow Up Instructions: I discussed the assessment and treatment plan with the patient. The patient was provided an opportunity to ask questions and all were answered. The patient agreed with the plan and demonstrated an understanding of the instructions.  A copy of instructions were sent to the patient via MyChart unless otherwise noted below.    The patient was advised to call back or seek an in-person evaluation if the symptoms worsen or if the condition fails to improve as anticipated.    Rose CHRISTELLA Dickinson, PA-C

## 2023-10-11 ENCOUNTER — Ambulatory Visit: Admitting: Family Medicine

## 2023-10-11 ENCOUNTER — Encounter: Payer: Self-pay | Admitting: Family Medicine

## 2023-10-11 ENCOUNTER — Other Ambulatory Visit: Payer: Self-pay

## 2023-10-11 VITALS — BP 145/81 | HR 78 | Temp 98.3°F | Resp 18 | Ht 66.0 in | Wt 160.0 lb

## 2023-10-11 DIAGNOSIS — I152 Hypertension secondary to endocrine disorders: Secondary | ICD-10-CM | POA: Diagnosis not present

## 2023-10-11 DIAGNOSIS — E1165 Type 2 diabetes mellitus with hyperglycemia: Secondary | ICD-10-CM

## 2023-10-11 DIAGNOSIS — E1159 Type 2 diabetes mellitus with other circulatory complications: Secondary | ICD-10-CM | POA: Diagnosis not present

## 2023-10-11 DIAGNOSIS — E782 Mixed hyperlipidemia: Secondary | ICD-10-CM

## 2023-10-11 DIAGNOSIS — E118 Type 2 diabetes mellitus with unspecified complications: Secondary | ICD-10-CM | POA: Diagnosis not present

## 2023-10-11 LAB — POCT GLYCOSYLATED HEMOGLOBIN (HGB A1C): Hemoglobin A1C: 15 % — AB (ref 4.0–5.6)

## 2023-10-11 MED ORDER — AMLODIPINE BESYLATE 5 MG PO TABS: 5.0000 mg | ORAL_TABLET | Freq: Every day | ORAL | 1 refills | Status: DC

## 2023-10-11 MED ORDER — ATORVASTATIN CALCIUM 40 MG PO TABS: 40.0000 mg | ORAL_TABLET | Freq: Every day | ORAL | 1 refills | Status: DC

## 2023-10-11 MED ORDER — LANTUS SOLOSTAR 100 UNIT/ML ~~LOC~~ SOPN: 50.0000 [IU] | PEN_INJECTOR | Freq: Every day | SUBCUTANEOUS | 5 refills | Status: DC

## 2023-10-11 MED ORDER — DAPAGLIFLOZIN PROPANEDIOL 10 MG PO TABS: 10.0000 mg | ORAL_TABLET | Freq: Every day | ORAL | 3 refills | Status: DC

## 2023-10-11 MED ORDER — DEXCOM G7 SENSOR MISC: 3 refills | Status: DC

## 2023-10-11 NOTE — Assessment & Plan Note (Signed)
 Starting back on glargine insulin.  10 units for 3 days then increase by 2 units every three days until fasting glucose is 130.  Restart Farxiga  10mg  a day.  FOLLOW-UP in a month

## 2023-10-11 NOTE — Progress Notes (Signed)
 New Patient Office Visit  Subjective    Patient ID: Rose Thomas, female    DOB: [birth year]  Age: 53 y.o. MRN: [id number]  CC:  Chief Complaint  Patient presents with   Establish Care    HPI [name] presents to establish care..   She followed with me at the office in [place].   Discussed the use of AI scribe software for clinical note transcription with the patient, who gave verbal consent to proceed.  History of Present Illness   Rose Thomas is a 53 year old female with diabetes who presents with uncontrolled blood sugar levels.  Her A1c is 15% today, which she attributes to being out of her diabetes medications for a couple of months. She feels 'okay' but acknowledges feeling 'dragging' and not at her best.  She does not currently have a Dexcom sensor due to pharmacy issues. She has previously used Lantus  and Levemir for insulin management, starting with 10 units of Lantus  and increasing by 2 units every three days until her fasting blood sugar reached 130. She has tried metformin in the past but discontinued it due to gastrointestinal side effects.  For hypertension, she is taking lisinopril 20 mg but has not taken amlodipine  5 mg recently. Lisinopril causes frequent urination. She is also on Lipitor for cholesterol management.  She mentions financial concerns impacting her ability to afford medications, noting that a $5 savings per prescription is significant for her budget.     Outpatient Encounter Medications as of 10/11/2023  Medication Sig   aspirin EC 81 MG tablet Take 81 mg by mouth daily.   Dorzolamide HCl-Timolol Mal PF 2-0.5 % SOLN Place 1 drop into the left eye daily.   potassium chloride  (MICRO-K ) 10 MEQ CR capsule Take 10 mEq by mouth daily.   [DISCONTINUED] amLODipine  (NORVASC ) 5 MG tablet Take 5 mg by mouth daily.   [DISCONTINUED] atorvastatin  (LIPITOR) 40 MG tablet Take 40 mg by mouth daily.   [DISCONTINUED] Continuous Glucose Sensor (DEXCOM G7 SENSOR) MISC  See admin instructions.   [DISCONTINUED] dapagliflozin  propanediol (FARXIGA ) 10 MG TABS tablet Take 10 mg by mouth daily.   [DISCONTINUED] insulin glargine  (LANTUS  SOLOSTAR) 100 UNIT/ML Solostar Pen INJECT 20 UNITS SUBCUTANEOUSLY PER PRESCRIBER INSTRUCTIONS. INSULIN DOSING REQUIRES INDIVIDUALIZATION. MAX OF 35 UNITS PER DAY.   amLODipine  (NORVASC ) 5 MG tablet Take 1 tablet (5 mg total) by mouth daily.   atorvastatin  (LIPITOR) 40 MG tablet Take 1 tablet (40 mg total) by mouth daily.   Continuous Glucose Sensor (DEXCOM G7 SENSOR) MISC Apply new sensor every 30 days   dapagliflozin  propanediol (FARXIGA ) 10 MG TABS tablet Take 1 tablet (10 mg total) by mouth daily.   ibuprofen  (ADVIL ) 600 MG tablet Take 1 tablet (600 mg total) by mouth every 8 (eight) hours as needed.   insulin glargine  (LANTUS  SOLOSTAR) 100 UNIT/ML Solostar Pen Inject 50 Units into the skin daily.   [DISCONTINUED] HYDROcodone -acetaminophen  (NORCO) 5-325 MG tablet Take 1 tablet by mouth every 6 (six) hours as needed for moderate pain.   [DISCONTINUED] ibuprofen  (ADVIL ,MOTRIN ) 600 MG tablet Take 1 tablet (600 mg total) by mouth every 6 (six) hours as needed.   [DISCONTINUED] insulin detemir (LEVEMIR) 100 UNIT/ML injection Inject 20 Units into the skin at bedtime.   [DISCONTINUED] metFORMIN (GLUCOPHAGE) 500 MG tablet Take 500 mg by mouth 2 (two) times daily with a meal.   [DISCONTINUED] moxifloxacin  (VIGAMOX ) 0.5 % ophthalmic solution Place 1 drop into the left eye 3 (three) times  daily. For 5 days   No facility-administered encounter medications on file as of 10/11/2023.    Past Medical History:  Diagnosis Date   Diabetes mellitus without complication (HCC)    Hypertension     Past Surgical History:  Procedure Laterality Date   ADRENAL GLAND SURGERY     removed on right side.   APPENDECTOMY      History reviewed. No pertinent family history.  Social History   Socioeconomic History   Marital status: Single    Spouse  name: Not on file   Number of children: Not on file   Years of education: Not on file   Highest education level: Not on file  Occupational History   Not on file  Tobacco Use   Smoking status: Never   Smokeless tobacco: Never  Substance and Sexual Activity   Alcohol use: Yes    Comment: occasional   Drug use: Not on file   Sexual activity: Not on file  Other Topics Concern   Not on file  Social History Narrative   Not on file   Social Drivers of Health   Financial Resource Strain: Not on file  Food Insecurity: Not on file  Transportation Needs: Not on file  Physical Activity: Not on file  Stress: Not on file  Social Connections: Not on file  Intimate Partner Violence: Not on file    ROS      Objective   BP (!) 145/81 (BP Location: Left Arm, Patient Position: Sitting, Cuff Size: Normal)   Pulse 78   Temp 98.3 F (36.8 C) (Oral)   Resp 18   Ht 5' 6 (1.676 m)   Wt 160 lb (72.6 kg)   LMP 01/13/2017   SpO2 (!) 0%   BMI 25.82 kg/m    Physical Exam Vitals reviewed.  Constitutional:      Appearance: Normal appearance.  HENT:     Head: Normocephalic.  Eyes:     General:        Right eye: No discharge.        Left eye: No discharge.  Cardiovascular:     Rate and Rhythm: Normal rate.  Pulmonary:     Effort: Pulmonary effort is normal.  Neurological:     Mental Status: She is alert and oriented to person, place, and time.  Psychiatric:        Mood and Affect: Mood normal.        Behavior: Behavior normal.        Thought Content: Thought content normal.        Judgment: Judgment normal.            The ASCVD Risk score (Arnett DK, et al., 2019) failed to calculate for the following reasons:   Cannot find a previous HDL lab   Cannot find a previous total cholesterol lab     Assessment & Plan:  Uncontrolled type 2 diabetes mellitus with hyperglycemia (HCC) -     Dexcom G7 Sensor; Apply new sensor every 30 days  Dispense: 3 each; Refill: 3 -      Dapagliflozin  Propanediol; Take 1 tablet (10 mg total) by mouth daily.  Dispense: 90 tablet; Refill: 3 -     Lantus  SoloStar; Inject 50 Units into the skin daily.  Dispense: 15 mL; Refill: 5 -     POCT glycosylated hemoglobin (Hb A1C)  Hypertension associated with diabetes (HCC) Assessment & Plan: Is relatively dehydrated from glucosuria from uncontrolled diabetes.  Hold lisinopril for now  as it acts as a diuretic and can increase potassium levels.    Restart amlodipine  5 mg daily for HTN control.    Orders: -     amLODIPine  Besylate; Take 1 tablet (5 mg total) by mouth daily.  Dispense: 90 tablet; Refill: 1  Mixed hyperlipidemia -     Atorvastatin  Calcium ; Take 1 tablet (40 mg total) by mouth daily.  Dispense: 90 tablet; Refill: 1  Diabetes mellitus type 2 with complications (HCC) Assessment & Plan: Starting back on glargine insulin.  10 units for 3 days then increase by 2 units every three days until fasting glucose is 130.  Restart Farxiga  10mg  a day.  FOLLOW-UP in a month   Assessment and Plan    Type 2 diabetes mellitus with severe hyperglycemia Severe hyperglycemia with an A1c of 15% due to medication non-adherence for a couple of months. Symptoms include malaise and fatigue. Gradual control of blood glucose is necessary to avoid adverse effects such as dizziness. - Prescribe Farxiga  10 mg daily to reduce blood glucose by increasing urination. - Prescribe Lantus  starting at 10 units daily for 3 days, then increase by 2 units every 3 days until fasting blood glucose is 130 mg/dL. - Discuss potential dizziness due to rapid reduction in blood glucose levels. - Schedule follow-up in one month to assess progress and consider adding mealtime insulin. - Set up automatic refill for Dexcom sensors.  Hypertension Currently managed with lisinopril 20 mg. She reports taking lisinopril but not amlodipine . Holding lisinopril due to dehydration risk and continuing amlodipine  as blood glucose  control improves. - Continue amlodipine . - Hold lisinopril until blood glucose control improves.  Hyperlipidemia Currently managed with Lipitor. - Continue Lipitor.  General Health Maintenance Discussed shingles vaccination due to weakened immune system from high blood glucose. She has not received shingles vaccine and is considering it. Discussed potential side effects of vaccines, including soreness and malaise. - Consider shingles vaccination after discussion with family.        Return in about 4 weeks (around 11/08/2023).   Laina Guerrieri K Edrian Melucci, MD

## 2023-10-11 NOTE — Assessment & Plan Note (Signed)
 Is relatively dehydrated from glucosuria from uncontrolled diabetes.  Hold lisinopril for now as it acts as a diuretic and can increase potassium levels.    Restart amlodipine  5 mg daily for HTN control.

## 2023-10-14 ENCOUNTER — Other Ambulatory Visit: Payer: Self-pay | Admitting: Family Medicine

## 2023-10-14 DIAGNOSIS — E1165 Type 2 diabetes mellitus with hyperglycemia: Secondary | ICD-10-CM

## 2023-10-14 NOTE — Telephone Encounter (Unsigned)
 Copied from CRM [id number]. Topic: Clinical - Medication Refill >> Oct 14, 2023  3:01 PM Emylou G wrote: Medication: atorvastatin  (LIPITOR) 40 MG tablet Continuous Glucose Sensor (DEXCOM G7 SENSOR) MISC dapagliflozin  propanediol (FARXIGA ) 10 MG TABS tablet Dorzolamide HCl-Timolol Mal PF 2-0.5 % SOLN insulin glargine  (LANTUS  SOLOSTAR) 100 UNIT/ML Solostar Pen potassium chloride  (MICRO-K ) 10 MEQ CR capsule   Has the patient contacted their pharmacy? No (Agent: If no, request that the patient contact the pharmacy for the refill. If patient does not wish to contact the pharmacy document the reason why and proceed with request.) (Agent: If yes, when and what did the pharmacy advise?)  This is the patient's preferred pharmacy:  [facility] [address] ([address]), [place] - [address] [place]) [place] [address] Phone: [phone] Fax: [phone]  Is this the correct pharmacy for this prescription? Yes If no, delete pharmacy and type the correct one.   Has the prescription been filled recently? No  Is the patient out of the medication? Yes  Has the patient been seen for an appointment in the last year OR does the patient have an upcoming appointment? Yes  Can we respond through MyChart? Yes  Agent: Please be advised that Rx refills may take up to 3 business days. We ask that you follow-up with your pharmacy.

## 2023-10-15 MED ORDER — DAPAGLIFLOZIN PROPANEDIOL 10 MG PO TABS: 10.0000 mg | ORAL_TABLET | Freq: Every day | ORAL | 3 refills | Status: DC

## 2023-10-15 MED ORDER — LANTUS SOLOSTAR 100 UNIT/ML ~~LOC~~ SOPN: 50.0000 [IU] | PEN_INJECTOR | Freq: Every day | SUBCUTANEOUS | 5 refills | Status: AC

## 2023-10-15 MED ORDER — DEXCOM G7 SENSOR MISC: 3 refills | Status: DC

## 2023-10-15 MED ORDER — POTASSIUM CHLORIDE ER 10 MEQ PO CPCR: 10.0000 meq | ORAL_CAPSULE | Freq: Every day | ORAL | 1 refills | Status: AC

## 2023-11-10 ENCOUNTER — Ambulatory Visit: Admitting: Family Medicine

## 2023-11-10 ENCOUNTER — Other Ambulatory Visit: Payer: Self-pay

## 2023-11-11 NOTE — Telephone Encounter (Signed)
 Spoke with pharmacist at [facility] who states that Amlodipine  is ready for pick up. Dexcom G7 requires a prior authorization. Patient has been informed.     (Key: [id number])  PA Case ID #: [id number]  Status Sent to Plan today  Drug Dexcom G7 Sensor  ePA cloud logo Form [employer] Electronic PA Form [phone] NCPDP)

## 2023-11-11 NOTE — Telephone Encounter (Unsigned)
 Copied from CRM #[id number]. Topic: Clinical - Medication Question >> Nov 10, 2023  4:47 PM Lonell PEDLAR wrote: Reason for CRM: Patient stated that there are issues with the pharmacy filling her medication for amLODipine  (NORVASC ) 5 MG tablet and have to provided her with a Dexcom either, please review and contact patient to advise

## 2023-11-11 NOTE — Telephone Encounter (Signed)
 Outcome  Approved today by [employer] 2017 [employer]  Your PA request has been approved. Additional information will be provided in the approval communication.   Effective Date: 11/11/2023  Authorization Expiration Date: 11/10/2024

## 2023-11-24 ENCOUNTER — Ambulatory Visit: Payer: Self-pay

## 2023-11-24 NOTE — Telephone Encounter (Signed)
 Unable to reach patient for triage x 3 attempts.

## 2023-11-24 NOTE — Telephone Encounter (Signed)
 Reason for Disposition . MODERATE-SEVERE itching (i.e., interferes with school, work, or sleep)  Answer Assessment - Initial Assessment Questions Pt returning missed call x3 from triage.   1. SYMPTOM: What's the main symptom you're concerned about? (e.g., pain, itching, dryness)     Itching and pain  2. LOCATION: Where is the  itching and pain located? (e.g., inside/outside, left/right)     Inner and out labia  3. ONSET: When did the vaginal itching  start?     3 days ago  4. PAIN: Is there any pain? If Yes, ask: How bad is it? (Scale: 1-10; mild, moderate, severe)     5/10   5. ITCHING: Is there any itching? If Yes, ask: How bad is it? (Scale: 1-10; mild, moderate, severe)     Severe  6. CAUSE: What do you think is causing the discharge? Have you had the same problem before? What happened then?     Skin is hypersensitive to ph changes. Has occurred in the past  7. OTHER SYMPTOMS: Do you have any other symptoms? (e.g., fever, itching, vaginal bleeding, pain with urination, injury to genital area, vaginal foreign body)     No bleeding, thick white discharge, no fever, no n/v.  Protocols used: Vaginal Symptoms-A-AH

## 2023-11-24 NOTE — Telephone Encounter (Signed)
 Copied from CRM [id number]. Topic: Clinical - Medical Advice >> Nov 24, 2023 11:42 AM Rose Thomas wrote: Reason for CRM: Patient says has itching in vaginal area and she knows her ph is off. She is asking for Dr Ziglar to send in something to pharmacy for this , she didn't want appt  Phone call placed to patient-no answer. No voicemail set up. Will place in callbacks

## 2023-11-24 NOTE — Telephone Encounter (Signed)
 FYI Only or Action Required?: FYI only for provider: appointment scheduled on 11/25/23.  Patient was last seen in primary care on 10/11/2023 by Ziglar, Susan K, MD.  Called Nurse Triage reporting Vaginal Itching.  Symptoms began several days ago.  Interventions attempted: Other: Changing soaps, personal hygiene.  Symptoms are: gradually worsening.  Triage Disposition: See Physician Within 24 Hours  Patient/caregiver understands and will follow disposition?: Yes  Pt returning missed call x3 from triage.   VAGINAL SYMPTOMS  1. SYMPTOM: What's the main symptom you're concerned about? (e.g., pain, itching, dryness)     Itching and pain  2. LOCATION: Where is the  itching and pain located? (e.g., inside/outside, left/right)     Inner and out labia  3. ONSET: When did the vaginal itching  start?     3 days ago  4. PAIN: Is there any pain? If Yes, ask: How bad is it? (Scale: 1-10; mild, moderate, severe)     5/10   5. ITCHING: Is there any itching? If Yes, ask: How bad is it? (Scale: 1-10; mild, moderate, severe)     Severe  6. CAUSE: What do you think is causing the discharge? Have you had the same problem before? What happened then?     Skin is hypersensitive to ph changes. Has occurred in the past  7. OTHER SYMPTOMS: Do you have any other symptoms? (e.g., fever, itching, vaginal bleeding, pain with urination, injury to genital area, vaginal foreign body)     No bleeding, thick white discharge, no fever, no n/v.

## 2023-11-25 ENCOUNTER — Ambulatory Visit: Admitting: Family Medicine

## 2023-12-10 ENCOUNTER — Ambulatory Visit: Admitting: Family Medicine

## 2024-01-24 ENCOUNTER — Telehealth: Payer: Self-pay

## 2024-01-24 DIAGNOSIS — E782 Mixed hyperlipidemia: Secondary | ICD-10-CM

## 2024-01-24 DIAGNOSIS — E1165 Type 2 diabetes mellitus with hyperglycemia: Secondary | ICD-10-CM

## 2024-01-24 DIAGNOSIS — I152 Hypertension secondary to endocrine disorders: Secondary | ICD-10-CM

## 2024-01-24 NOTE — Telephone Encounter (Signed)
" °  Please schedule patient to be seen by PCP to discuss bloodwork needed as she was asked to return in October.   Copied from CRM [id number]. Topic: Clinical - Request for Lab/Test Order >> Jan 24, 2024 12:02 PM Joesph NOVAK wrote: Reason for CRM: pt is requesting blood work. Please fu. "

## 2024-01-24 NOTE — Telephone Encounter (Unsigned)
 Copied from CRM [id number]. Topic: Clinical - Medication Refill >> Jan 24, 2024 11:57 AM Joesph B wrote: Medication:  Continuous Glucose Sensor (DEXCOM G7 SENSOR) MISC  amLODipine  (NORVASC ) 5 MG tablet [[id number]]  dapagliflozin  propanediol (FARXIGA ) 10 MG TABS tablet [[id number]]  atorvastatin  (LIPITOR) 40 MG tablet [[id number]]  Has the patient contacted their pharmacy? Yes (Agent: If no, request that the patient contact the pharmacy for the refill. If patient does not wish to contact the pharmacy document the reason why and proceed with request.) (Agent: If yes, when and what did the pharmacy advise?)  This is the patient's preferred pharmacy:  [facility] [address] ([address]), [place] - [address] [place]) [place] [address] Phone: [phone] Fax: [phone]  Is this the correct pharmacy for this prescription? Yes If no, delete pharmacy and type the correct one.   Has the prescription been filled recently? No  Is the patient out of the medication? Yes  Has the patient been seen for an appointment in the last year OR does the patient have an upcoming appointment? Yes  Can we respond through MyChart? Yes  Agent: Please be advised that Rx refills may take up to 3 business days. We ask that you follow-up with your pharmacy.

## 2024-01-25 MED ORDER — DEXCOM G7 SENSOR MISC: 3 refills | Status: DC

## 2024-01-25 MED ORDER — AMLODIPINE BESYLATE 5 MG PO TABS: 5.0000 mg | ORAL_TABLET | Freq: Every day | ORAL | 1 refills | Status: AC

## 2024-01-25 MED ORDER — ATORVASTATIN CALCIUM 40 MG PO TABS: 40.0000 mg | ORAL_TABLET | Freq: Every day | ORAL | 1 refills | Status: AC

## 2024-01-25 MED ORDER — DAPAGLIFLOZIN PROPANEDIOL 10 MG PO TABS: 10.0000 mg | ORAL_TABLET | Freq: Every day | ORAL | 3 refills | Status: AC

## 2024-01-26 ENCOUNTER — Encounter: Payer: Self-pay | Admitting: Physician Assistant

## 2024-01-26 ENCOUNTER — Ambulatory Visit: Admitting: Physician Assistant

## 2024-01-26 VITALS — BP 142/82 | HR 72 | Temp 98.6°F | Ht 66.0 in | Wt 165.0 lb

## 2024-01-26 DIAGNOSIS — Z7984 Long term (current) use of oral hypoglycemic drugs: Secondary | ICD-10-CM | POA: Diagnosis not present

## 2024-01-26 DIAGNOSIS — Z794 Long term (current) use of insulin: Secondary | ICD-10-CM | POA: Diagnosis not present

## 2024-01-26 DIAGNOSIS — E118 Type 2 diabetes mellitus with unspecified complications: Secondary | ICD-10-CM | POA: Diagnosis not present

## 2024-01-26 DIAGNOSIS — R21 Rash and other nonspecific skin eruption: Secondary | ICD-10-CM | POA: Diagnosis not present

## 2024-01-26 LAB — POCT GLYCOSYLATED HEMOGLOBIN (HGB A1C): Hemoglobin A1C: 13.5 % — AB (ref 4.0–5.6)

## 2024-01-26 MED ORDER — TRIAMCINOLONE ACETONIDE 0.1 % EX CREA: 1.0000 | TOPICAL_CREAM | Freq: Two times a day (BID) | CUTANEOUS | 0 refills | Status: AC

## 2024-01-26 MED ORDER — HYDROXYZINE HCL 25 MG PO TABS: 25.0000 mg | ORAL_TABLET | Freq: Three times a day (TID) | ORAL | 0 refills | Status: AC | PRN

## 2024-01-26 MED ORDER — FAMOTIDINE 20 MG PO TABS: 20.0000 mg | ORAL_TABLET | Freq: Two times a day (BID) | ORAL | 0 refills | Status: AC

## 2024-01-26 NOTE — Progress Notes (Signed)
 "   Date:  01/26/2024   Name:  Rose Thomas   DOB:  December 20, 1970   MRN:  [id number]   Chief Complaint: Rash  Rash This is a new problem. The current episode started yesterday. The problem is unchanged. The affected locations include the left hand and right hand. The rash is characterized by burning, pain, itchiness, dryness and redness. She was exposed to nothing. Past treatments include nothing.    Rose Thomas is a very pleasant 53 year old female with poorly controlled diabetes (last A1c unmeasureable >15%) who typically sees my colleague Dr. Ziglar but is new to our practice today for acute evaluation of a rash on both palms which began yesterday around 10 AM.  It is slightly pruritic and associated with a mild burning sensation.  She cannot recall any environmental exposure or any new foods/medications/detergents.  She otherwise feels well and denies any URI symptoms.  She has been around young children recently, one of them had RSV, nobody with HFM to her knowledge.  Medication list has been reviewed and updated.  Active Medications[1]   Review of Systems  Skin:  Positive for rash.    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   APPENDECTOMY      Social History[3]  History reviewed. No pertinent family history.      01/26/2024   10:30 AM  GAD 7 : Generalized Anxiety Score  Nervous, Anxious, on Edge 1  Control/stop worrying 1  Worry too much - different things 1  Trouble relaxing 0  Restless 0  Easily annoyed or irritable 0  Afraid - awful  might happen 0  Total GAD 7 Score 3  Anxiety Difficulty Not difficult at all       01/26/2024   10:30 AM  Depression screen PHQ 2/9  Decreased Interest 0  Down, Depressed, Hopeless 0  PHQ - 2 Score 0    BP Readings from Last 3 Encounters:  01/26/24 (!) 142/82  10/11/23 (!) 145/81  12/27/19 (!) 188/99    Wt Readings from Last 3 Encounters:  01/26/24 165 lb (74.8 kg)  10/11/23 160 lb (72.6 kg)  12/27/19 160 lb (72.6 kg)    BP (!) 142/82 (Cuff Size: Large)   Pulse 72   Temp 98.6 F (37 C)   Ht 5' 6 (1.676 m)   Wt 165 lb (74.8 kg)   LMP 01/13/2017   SpO2 96%   BMI 26.63 kg/m   Physical Exam Vitals and nursing note reviewed.  Constitutional:      Appearance: Normal appearance.  Cardiovascular:     Rate and Rhythm: Normal rate.  Pulmonary:     Effort: Pulmonary effort is normal.  Abdominal:     General: There is no distension.  Musculoskeletal:        General: Normal range of motion.  Skin:    General: Skin is warm and dry.     Findings: Rash (Moderate maculopapular rash of bilateral palmar surfaces as pictured below.  Not particularly tender to palpation.  Dorsal aspect is spared.  No involvement anywhere else on the body.)  present.  Neurological:     Mental Status: She is alert and oriented to person, place, and time.     Gait: Gait is intact.  Psychiatric:        Mood and Affect: Mood and affect normal.        Recent Labs     Component Value Date/Time   NA 138 05/17/2019 2020   NA 141 03/15/2011 0316   K 3.6 05/17/2019 2020   K 2.7 (L) 03/15/2011 0316   CL 101 05/17/2019 2020   CL 106 03/15/2011 0316   CO2 27 05/17/2019 2020   CO2 22 03/15/2011 0316   GLUCOSE 379 (H) 05/17/2019 2020   GLUCOSE 218 (H) 03/15/2011 0316   BUN 13 05/17/2019 2020   BUN 3 (L) 03/15/2011 0316   CREATININE 1.02 (H) 05/17/2019 2020   CREATININE 0.85 03/15/2011 0316   CALCIUM  9.2 05/17/2019 2020   CALCIUM  8.2 (L) 03/15/2011 0316   PROT 7.7 05/17/2019 2020   PROT  7.8 03/15/2011 0316   ALBUMIN 4.2 05/17/2019 2020   ALBUMIN 3.8 03/15/2011 0316   AST 18 05/17/2019 2020   AST 17 03/15/2011 0316   ALT 20 05/17/2019 2020   ALT 20 03/15/2011 0316   ALKPHOS 85 05/17/2019 2020   ALKPHOS 66 03/15/2011 0316   BILITOT 0.7 05/17/2019 2020   BILITOT 0.7 03/15/2011 0316   GFRNONAA >60 05/17/2019 2020   GFRNONAA >60 03/15/2011 0316   GFRAA >60 05/17/2019 2020   GFRAA >60 03/15/2011 0316    Lab Results  Component Value Date   WBC 8.5 05/17/2019   HGB 14.0 05/17/2019   HCT 42.3 05/17/2019   MCV 82.5 05/17/2019   PLT 227 05/17/2019   Lab Results  Component Value Date   HGBA1C 13.5 (A) 01/26/2024   HGBA1C 15.0 (A) 10/11/2023   No results found for: CHOL, HDL, LDLCALC, LDLDIRECT, TRIG, CHOLHDL No results found for: TSH    Assessment and Plan:  1. Rash of both hands (Primary) Peculiar presentation.  Will avoid systemic steroid with uncontrolled diabetes.  Instead, treat for allergen/contact dermatitis with hydroxyzine, famotidine , and topical triamcinolone.  - triamcinolone cream (KENALOG) 0.1 %; Apply 1 Application topically 2 (two) times daily.  Dispense: 30 g; Refill: 0 - hydrOXYzine (ATARAX) 25 MG tablet; Take 1 tablet (25 mg total) by mouth 3 (three) times daily as needed. May cause drowsiness  Dispense: 30 tablet; Refill: 0 - famotidine  (PEPCID ) 20 MG tablet; Take 1 tablet (20 mg total) by mouth 2 (two) times daily.  Dispense: 30 tablet; Refill: 0  2. Diabetes mellitus type 2 with complications (HCC) A1c 13.5% today, improved from previous but still grossly uncontrolled.  Patient has been wearing Dexcom G7 for 1 day, glucose about 200 around the time of our visit but peaked to 400 earlier this morning.  Once enough data is collected, CGM should be able to evaluate GMI which I discussed briefly with patient today.  Further management to be guided by PCP.  - POCT HgB A1C   Follow-up with PCP.  I was able to schedule her an  appointment with Dr. Ziglar on 02/09/2024   Rolan Hoyle, PA-C, DMSc, DipACLM, Nutritionist [facility] [phone]      [1]  Current Meds  Medication Sig   amLODipine  (NORVASC ) 5 MG tablet Take 1 tablet (5 mg total) by mouth daily.   aspirin EC 81 MG tablet Take 81 mg by mouth daily.  atorvastatin  (LIPITOR) 40 MG tablet Take 1 tablet (40 mg total) by mouth daily.   Continuous Glucose Sensor (DEXCOM G7 SENSOR) MISC Apply new sensor every 30 days   dapagliflozin  propanediol (FARXIGA ) 10 MG TABS tablet Take 1 tablet (10 mg total) by mouth daily.   Dorzolamide HCl-Timolol Mal PF 2-0.5 % SOLN Place 1 drop into the left eye daily.   famotidine  (PEPCID ) 20 MG tablet Take 1 tablet (20 mg total) by mouth 2 (two) times daily.   hydrOXYzine (ATARAX) 25 MG tablet Take 1 tablet (25 mg total) by mouth 3 (three) times daily as needed. May cause drowsiness   ibuprofen  (ADVIL ) 600 MG tablet Take 1 tablet (600 mg total) by mouth every 8 (eight) hours as needed.   insulin glargine  (LANTUS  SOLOSTAR) 100 UNIT/ML Solostar Pen Inject 50 Units into the skin daily.   insulin glargine -yfgn (SEMGLEE) 100 UNIT/ML Pen SMARTSIG:50 Unit(s) SUB-Q Daily   potassium chloride  (MICRO-K ) 10 MEQ CR capsule Take 1 capsule (10 mEq total) by mouth daily.   triamcinolone cream (KENALOG) 0.1 % Apply 1 Application topically 2 (two) times daily.  [2]  Allergies Allergen Reactions   Codeine Nausea Only   Oxycodone-Acetaminophen  Nausea And Vomiting, Nausea Only and Other (See Comments)    Other Reaction(s): GI Intolerance, Vomiting   Penicillins Rash, Dermatitis and Hives   Propoxyphene Other (See Comments), Nausea And Vomiting and Nausea Only   Percocet [Oxycodone-Acetaminophen ] Nausea And Vomiting  [3]  Social History Tobacco Use   Smoking status: Never   Smokeless tobacco: Never  Substance Use Topics   Alcohol use: Yes    Comment:  occasional   "

## 2024-02-01 DIAGNOSIS — E1165 Type 2 diabetes mellitus with hyperglycemia: Secondary | ICD-10-CM

## 2024-02-04 ENCOUNTER — Other Ambulatory Visit: Payer: Self-pay | Admitting: Family Medicine

## 2024-02-04 DIAGNOSIS — E1165 Type 2 diabetes mellitus with hyperglycemia: Secondary | ICD-10-CM

## 2024-02-09 ENCOUNTER — Ambulatory Visit: Admitting: Family Medicine

## 2024-02-09 ENCOUNTER — Encounter: Payer: Self-pay | Admitting: Family Medicine

## 2024-02-09 VITALS — HR 76 | Temp 98.4°F | Resp 16 | Ht 65.0 in | Wt 178.0 lb

## 2024-02-09 DIAGNOSIS — Z794 Long term (current) use of insulin: Secondary | ICD-10-CM

## 2024-02-09 DIAGNOSIS — I152 Hypertension secondary to endocrine disorders: Secondary | ICD-10-CM | POA: Diagnosis not present

## 2024-02-09 DIAGNOSIS — E1159 Type 2 diabetes mellitus with other circulatory complications: Secondary | ICD-10-CM | POA: Diagnosis not present

## 2024-02-09 DIAGNOSIS — E118 Type 2 diabetes mellitus with unspecified complications: Secondary | ICD-10-CM | POA: Diagnosis not present

## 2024-02-09 MED ORDER — CLONIDINE HCL 0.1 MG PO TABS
0.1000 mg | ORAL_TABLET | Freq: Once | ORAL | Status: AC
  Administered 2024-02-09: 0.1 mg via ORAL

## 2024-02-09 MED ORDER — TIRZEPATIDE-WEIGHT MANAGEMENT 2.5 MG/0.5ML ~~LOC~~ SOLN: 2.5000 mg | SUBCUTANEOUS | 0 refills | Status: DC

## 2024-02-10 LAB — CMP14+EGFR
ALT: 19 IU/L (ref 0–32)
AST: 19 IU/L (ref 0–40)
Albumin: 3.7 g/dL — ABNORMAL LOW (ref 3.8–4.9)
Alkaline Phosphatase: 94 IU/L (ref 49–135)
BUN/Creatinine Ratio: 19 (ref 9–23)
BUN: 20 mg/dL (ref 6–24)
Bilirubin Total: 0.2 mg/dL (ref 0.0–1.2)
CO2: 22 mmol/L (ref 20–29)
Calcium: 8.7 mg/dL (ref 8.7–10.2)
Chloride: 106 mmol/L (ref 96–106)
Creatinine, Ser: 1.08 mg/dL — ABNORMAL HIGH (ref 0.57–1.00)
Globulin, Total: 2.4 g/dL (ref 1.5–4.5)
Glucose: 111 mg/dL — ABNORMAL HIGH (ref 70–99)
Potassium: 4.1 mmol/L (ref 3.5–5.2)
Sodium: 144 mmol/L (ref 134–144)
Total Protein: 6.1 g/dL (ref 6.0–8.5)
eGFR: 61 mL/min/1.73

## 2024-02-10 LAB — CBC WITH DIFFERENTIAL/PLATELET
Basophils Absolute: 0 x10E3/uL (ref 0.0–0.2)
Basos: 0 %
EOS (ABSOLUTE): 0.2 x10E3/uL (ref 0.0–0.4)
Eos: 3 %
Hematocrit: 35.3 % (ref 34.0–46.6)
Hemoglobin: 11.2 g/dL (ref 11.1–15.9)
Immature Grans (Abs): 0 x10E3/uL (ref 0.0–0.1)
Immature Granulocytes: 0 %
Lymphocytes Absolute: 3.8 x10E3/uL — ABNORMAL HIGH (ref 0.7–3.1)
Lymphs: 52 %
MCH: 26.9 pg (ref 26.6–33.0)
MCHC: 31.7 g/dL (ref 31.5–35.7)
MCV: 85 fL (ref 79–97)
Monocytes Absolute: 0.5 x10E3/uL (ref 0.1–0.9)
Monocytes: 7 %
Neutrophils Absolute: 2.7 x10E3/uL (ref 1.4–7.0)
Neutrophils: 38 %
Platelets: 240 x10E3/uL (ref 150–450)
RBC: 4.17 x10E6/uL (ref 3.77–5.28)
RDW: 12.9 % (ref 11.7–15.4)
WBC: 7.2 x10E3/uL (ref 3.4–10.8)

## 2024-02-10 LAB — LIPID PANEL
Chol/HDL Ratio: 3.8 ratio (ref 0.0–4.4)
Cholesterol, Total: 189 mg/dL (ref 100–199)
HDL: 50 mg/dL
LDL Chol Calc (NIH): 124 mg/dL — ABNORMAL HIGH (ref 0–99)
Triglycerides: 83 mg/dL (ref 0–149)
VLDL Cholesterol Cal: 15 mg/dL (ref 5–40)

## 2024-02-10 LAB — TSH+FREE T4
Free T4: 1.14 ng/dL (ref 0.82–1.77)
TSH: 0.961 u[IU]/mL (ref 0.450–4.500)

## 2024-02-10 NOTE — Assessment & Plan Note (Signed)
 Her A1c has improved to 13.1%.  Previous check was greater than 15%.  She has a Dexcom G7 sensor that is helping her track her blood sugars.  She is on Farxiga  but it is costing her $100.  She is also on Semglee insulin (Lantus ).  Please start with 10 units a day and increase by 2 units every 3 days.  Will start Zepbound  2.5 mg weekly.  Advise she may have issues with constipation and nausea.  Just stop Farxiga  because it is so expensive.  Follow-up in a month.

## 2024-02-10 NOTE — Progress Notes (Signed)
 "  Established Patient Office Visit  Subjective   Patient ID: Rose Thomas, female    DOB: [birth year]  Age: 54 y.o. MRN: [id number]  Chief Complaint  Patient presents with   Follow-up    Pt. Her for a follow-up visit for DM and HTN. NO other concerns. Pt. Denies pain.    HPI Discussed the use of AI scribe software for clinical note transcription with the patient, who gave verbal consent to proceed.  History of Present Illness   Rose Thomas is a 54 year old female with diabetes and hypertension who presents for follow-up of her diabetes management.  Her diabetes management includes the use of a Dexcom device for continuous glucose monitoring. Her hemoglobin A1c today is 13/1% from a previous >15%.  She is currently on Lantus  at 50 units nightly and Farxiga , though Farxiga  cost $100. She has not eaten today in anticipation of blood work.  She has hypertension and is on lisinopril and amlodipine . She usually takes her blood pressure medications at night but missed a dose last night. She resumed taking lisinopril today.  Two weeks ago, she developed a rash on her hands, described as 'a little red.' She was prescribed an antihistamine, Pepcid , and a prednisone cream. The rash has since become dry. She wonders if it could be related to her psoriasis, although she has never had psoriasis on her hands before.  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 She mentions the challenges of managing her workload, especially when substitute teachers cancel. She has not received a flu shot this year, citing previous adverse reactions such as feeling sick and achy.           Objective:     Pulse 76   Temp 98.4 F (36.9 C) (Oral)   Resp 16   Ht 5' 5 (1.651 m)   Wt 178 lb (80.7 kg)   LMP 01/13/2017   SpO2 95%   BMI 29.62 kg/m    Physical Exam Vitals and nursing note reviewed.  Constitutional:      Appearance: Normal  appearance.  HENT:     Head: Normocephalic and atraumatic.  Eyes:     Conjunctiva/sclera: Conjunctivae normal.  Cardiovascular:     Rate and Rhythm: Normal rate and regular rhythm.  Pulmonary:     Effort: Pulmonary effort is normal.     Breath sounds: Normal breath sounds.  Musculoskeletal:     Right lower leg: No edema.     Left lower leg: No edema.  Skin:    General: Skin is warm and dry.  Neurological:     Mental Status: She is alert and oriented to person, place, and time.  Psychiatric:        Mood and Affect: Mood normal.        Behavior: Behavior normal.        Thought Content: Thought content normal.        Judgment: Judgment normal.          Results for orders placed or performed in visit on 02/09/24  CMP14+EGFR  Result Value Ref Range   Glucose 111 (H) 70 - 99 mg/dL   BUN 20 6 - 24 mg/dL   Creatinine, Ser 8.91 (H) 0.57 - 1.00 mg/dL   eGFR 61 >40 fO/fpw/8.26   BUN/Creatinine Ratio 19 9 - 23   Sodium 144 134 - 144 mmol/L   Potassium 4.1 3.5 - 5.2 mmol/L  Chloride 106 96 - 106 mmol/L   CO2 22 20 - 29 mmol/L   Calcium  8.7 8.7 - 10.2 mg/dL   Total Protein 6.1 6.0 - 8.5 g/dL   Albumin 3.7 (L) 3.8 - 4.9 g/dL   Globulin, Total 2.4 1.5 - 4.5 g/dL   Bilirubin Total 0.2 0.0 - 1.2 mg/dL   Alkaline Phosphatase 94 49 - 135 IU/L   AST 19 0 - 40 IU/L   ALT 19 0 - 32 IU/L  TSH + free T4  Result Value Ref Range   TSH 0.961 0.450 - 4.500 uIU/mL   Free T4 1.14 0.82 - 1.77 ng/dL  CBC with Differential/Platelet  Result Value Ref Range   WBC 7.2 3.4 - 10.8 x10E3/uL   RBC 4.17 3.77 - 5.28 x10E6/uL   Hemoglobin 11.2 11.1 - 15.9 g/dL   Hematocrit 64.6 65.9 - 46.6 %   MCV 85 79 - 97 fL   MCH 26.9 26.6 - 33.0 pg   MCHC 31.7 31.5 - 35.7 g/dL   RDW 87.0 88.2 - 84.5 %   Platelets 240 150 - 450 x10E3/uL   Neutrophils 38 Not Estab. %   Lymphs 52 Not Estab. %   Monocytes 7 Not Estab. %   Eos 3 Not Estab. %   Basos 0 Not Estab. %   Neutrophils Absolute 2.7 1.4 - 7.0  x10E3/uL   Lymphocytes Absolute 3.8 (H) 0.7 - 3.1 x10E3/uL   Monocytes Absolute 0.5 0.1 - 0.9 x10E3/uL   EOS (ABSOLUTE) 0.2 0.0 - 0.4 x10E3/uL   Basophils Absolute 0.0 0.0 - 0.2 x10E3/uL   Immature Granulocytes 0 Not Estab. %   Immature Grans (Abs) 0.0 0.0 - 0.1 x10E3/uL  Lipid panel  Result Value Ref Range   Cholesterol, Total 189 100 - 199 mg/dL   Triglycerides 83 0 - 149 mg/dL   HDL 50 >60 mg/dL   VLDL Cholesterol Cal 15 5 - 40 mg/dL   LDL Chol Calc (NIH) 875 (H) 0 - 99 mg/dL   Chol/HDL Ratio 3.8 0.0 - 4.4 ratio      The 10-year ASCVD risk score (Arnett DK, et al., 2019) is: 15.1%    Assessment & Plan:  Hypertension associated with diabetes (HCC) Assessment & Plan: She is taking amlodipine  5 mg and lisinopril.  Her blood pressure would not come down in the office so we gave her clonidine  0.1 mg but this had little effect on her blood pressure.  Please continue to take lisinopril 20 and amlodipine  5.  Please check your blood pressures at home and record them.  Follow-up in a month.  Orders: -     CMP14+EGFR -     TSH + free T4 -     CBC with Differential/Platelet -     Lipid panel -     cloNIDine  HCl  Diabetes mellitus type 2 with complications (HCC) Assessment & Plan: Her A1c has improved to 13.1%.  Previous check was greater than 15%.  She has a Dexcom G7 sensor that is helping her track her blood sugars.  She is on Farxiga  but it is costing her $100.  She is also on Semglee insulin (Lantus ).  Please start with 10 units a day and increase by 2 units every 3 days.  Will start Zepbound  2.5 mg weekly.  Advise she may have issues with constipation and nausea.  Just stop Farxiga  because it is so expensive.  Follow-up in a month.  Orders: -     Tirzepatide -Weight Management; Inject  2.5 mg into the skin once a week.  Dispense: 2 mL; Refill: 0     Return in about 4 weeks (around 03/08/2024).    Jaecob Lowden K Pinkie Manger, MD "

## 2024-02-10 NOTE — Assessment & Plan Note (Addendum)
 She is taking amlodipine  5 mg and lisinopril.  Her blood pressure would not come down in the office so we gave her clonidine  0.1 mg but this had little effect on her blood pressure.  Please continue to take lisinopril 20 and amlodipine  5.  Please check your blood pressures at home and record them.  Follow-up in a month.

## 2024-02-12 ENCOUNTER — Other Ambulatory Visit: Payer: Self-pay | Admitting: Family Medicine

## 2024-02-12 DIAGNOSIS — E118 Type 2 diabetes mellitus with unspecified complications: Secondary | ICD-10-CM

## 2024-02-15 ENCOUNTER — Ambulatory Visit: Payer: Self-pay | Admitting: Family Medicine

## 2024-02-18 ENCOUNTER — Telehealth: Payer: Self-pay | Admitting: Family Medicine

## 2024-02-18 ENCOUNTER — Telehealth (HOSPITAL_BASED_OUTPATIENT_CLINIC_OR_DEPARTMENT_OTHER): Payer: Self-pay

## 2024-02-18 DIAGNOSIS — I152 Hypertension secondary to endocrine disorders: Secondary | ICD-10-CM

## 2024-02-18 MED ORDER — LISINOPRIL 20 MG PO TABS: 20.0000 mg | ORAL_TABLET | Freq: Every day | ORAL | 1 refills | Status: DC

## 2024-02-18 NOTE — Telephone Encounter (Signed)
 Copied from CRM [id number]. Topic: Clinical - Medication Refill >> Feb 18, 2024  4:16 PM Zebedee SAUNDERS wrote: Medication: Lisinopril  Has the patient contacted their pharmacy? Yes (Agent: If no, request that the patient contact the pharmacy for the refill. If patient does not wish to contact the pharmacy document the reason why and proceed with request.) (Agent: If yes, when and what did the pharmacy advise?)Pharmacy need script  This is the patient's preferred pharmacy:  [facility] [address] [place] - [address] [place]) [place] [address] Phone: [phone] Fax: [phone]  Is this the correct pharmacy for this prescription? Yes If no, delete pharmacy and type the correct one.   Has the prescription been filled recently? Yes  Is the patient out of the medication? Yes  Has the patient been seen for an appointment in the last year OR does the patient have an upcoming appointment? Yes  Can we respond through MyChart? Yes  Agent: Please be advised that Rx refills may take up to 3 business days. We ask that you follow-up with your pharmacy.

## 2024-02-18 NOTE — Telephone Encounter (Signed)
 Pt said she is asking about zepbound . [facility] said zepbound  is $1000 (with insurance). I verified this with [facility]. I informed pt to give her insurance a call and ask them what else is covered. She said she is out of lisinopril 20mg . We are sending this in now. Called pt on below:  Copied from CRM #[id number]. Topic: Clinical - Prescription Issue >> Feb 18, 2024  4:18 PM Zebedee SAUNDERS wrote: Reason for CRM: Pt stated insulin glargine  (LANTUS  SOLOSTAR) 100 UNIT/ML Solostar Pen Too expensive to please send something more affordable.

## 2024-02-21 ENCOUNTER — Other Ambulatory Visit: Payer: Self-pay

## 2024-02-21 DIAGNOSIS — E1159 Type 2 diabetes mellitus with other circulatory complications: Secondary | ICD-10-CM

## 2024-02-21 MED ORDER — LISINOPRIL 20 MG PO TABS: 20.0000 mg | ORAL_TABLET | Freq: Every day | ORAL | 1 refills | Status: AC
